# Patient Record
Sex: Female | Born: 1960 | Race: White | Hispanic: No | Marital: Married | Smoking: Never smoker
Health system: Southern US, Community
[De-identification: ages and names within clinical notes are randomized; demographics above are authoritative.]

## PROBLEM LIST (undated history)

## (undated) DIAGNOSIS — N83209 Unspecified ovarian cyst, unspecified side: Secondary | ICD-10-CM

## (undated) DIAGNOSIS — K5190 Ulcerative colitis, unspecified, without complications: Secondary | ICD-10-CM

## (undated) DIAGNOSIS — J45909 Unspecified asthma, uncomplicated: Secondary | ICD-10-CM

## (undated) DIAGNOSIS — F419 Anxiety disorder, unspecified: Secondary | ICD-10-CM

## (undated) DIAGNOSIS — E785 Hyperlipidemia, unspecified: Secondary | ICD-10-CM

## (undated) DIAGNOSIS — J189 Pneumonia, unspecified organism: Secondary | ICD-10-CM

## (undated) HISTORY — DX: Hyperlipidemia, unspecified: E78.5

## (undated) HISTORY — DX: Unspecified asthma, uncomplicated: J45.909

## (undated) HISTORY — DX: Pneumonia, unspecified organism: J18.9

## (undated) HISTORY — PX: COLOSTOMY: SHX63

## (undated) HISTORY — DX: Unspecified ovarian cyst, unspecified side: N83.209

## (undated) HISTORY — PX: CHOLECYSTECTOMY: SHX55

## (undated) HISTORY — DX: Anxiety disorder, unspecified: F41.9

## (undated) HISTORY — DX: Ulcerative colitis, unspecified, without complications: K51.90

---

## 2013-01-25 DIAGNOSIS — Z8719 Personal history of other diseases of the digestive system: Secondary | ICD-10-CM | POA: Insufficient documentation

## 2014-05-22 ENCOUNTER — Telehealth: Payer: Self-pay | Admitting: *Deleted

## 2014-05-22 DIAGNOSIS — Z0000 Encounter for general adult medical examination without abnormal findings: Secondary | ICD-10-CM

## 2014-05-22 NOTE — Telephone Encounter (Signed)
Routine mammogram orders placed.  Pt requesting appt same day as her appt with Dr Penne LashLeggett

## 2014-05-29 ENCOUNTER — Ambulatory Visit (INDEPENDENT_AMBULATORY_CARE_PROVIDER_SITE_OTHER): Payer: BC Managed Care – PPO

## 2014-05-29 DIAGNOSIS — Z1231 Encounter for screening mammogram for malignant neoplasm of breast: Secondary | ICD-10-CM

## 2014-05-29 DIAGNOSIS — Z0000 Encounter for general adult medical examination without abnormal findings: Secondary | ICD-10-CM

## 2014-06-11 ENCOUNTER — Encounter: Payer: Self-pay | Admitting: *Deleted

## 2014-06-12 ENCOUNTER — Encounter: Payer: Self-pay | Admitting: Obstetrics & Gynecology

## 2014-06-12 ENCOUNTER — Ambulatory Visit (INDEPENDENT_AMBULATORY_CARE_PROVIDER_SITE_OTHER): Payer: BC Managed Care – PPO | Admitting: Obstetrics & Gynecology

## 2014-06-12 VITALS — BP 138/89 | HR 96 | Resp 16 | Ht 60.0 in | Wt 164.0 lb

## 2014-06-12 DIAGNOSIS — E7800 Pure hypercholesterolemia, unspecified: Secondary | ICD-10-CM | POA: Insufficient documentation

## 2014-06-12 DIAGNOSIS — F339 Major depressive disorder, recurrent, unspecified: Secondary | ICD-10-CM | POA: Insufficient documentation

## 2014-06-12 DIAGNOSIS — F39 Unspecified mood [affective] disorder: Secondary | ICD-10-CM | POA: Insufficient documentation

## 2014-06-12 DIAGNOSIS — Z124 Encounter for screening for malignant neoplasm of cervix: Secondary | ICD-10-CM

## 2014-06-12 DIAGNOSIS — J45991 Cough variant asthma: Secondary | ICD-10-CM | POA: Insufficient documentation

## 2014-06-12 DIAGNOSIS — Z1151 Encounter for screening for human papillomavirus (HPV): Secondary | ICD-10-CM

## 2014-06-12 DIAGNOSIS — Z01419 Encounter for gynecological examination (general) (routine) without abnormal findings: Secondary | ICD-10-CM

## 2014-06-12 MED ORDER — ALBUTEROL SULFATE HFA 108 (90 BASE) MCG/ACT IN AERS: 2.0000 | INHALATION_SPRAY | Freq: Four times a day (QID) | RESPIRATORY_TRACT | Status: DC | PRN

## 2014-06-12 MED ORDER — BUPROPION HCL ER (SR) 150 MG PO TB12: 150.0000 mg | ORAL_TABLET | Freq: Two times a day (BID) | ORAL | Status: DC

## 2014-06-12 MED ORDER — ESCITALOPRAM OXALATE 10 MG PO TABS: 10.0000 mg | ORAL_TABLET | Freq: Every day | ORAL | Status: DC

## 2014-06-12 NOTE — Progress Notes (Signed)
  Subjective:    Renee Burns is a 53 y.o. female who presents for an annual exam. The patient has no complaints today. The patient is sexually active. GYN screening history: last pap: was normal. The patient wears seatbelts: yes. The patient participates in regular exercise: not asked. Has the patient ever been transfused or tattooed?: not asked. The patient reports that there is not domestic violence in her life.   Menstrual History: OB History   Grav Para Term Preterm Abortions TAB SAB Ect Mult Living   2 2 1 1      2       Patient's last menstrual period was 08/12/2012.   The following portions of the patient's history were reviewed and updated as appropriate: allergies, current medications, past family history, past medical history, past social history, past surgical history and problem list.  Review of Systems Pertinent items are noted in HPI.    Objective:      Filed Vitals:   06/12/14 1350  BP: 138/89  Pulse: 96  Resp: 16  Height: 5' (1.524 m)  Weight: 164 lb (74.39 kg)   Vitals:  WNL General appearance: alert, cooperative and no distress Head: Normocephalic, without obvious abnormality, atraumatic Eyes: negative Throat: lips, mucosa, and tongue normal; teeth and gums normal Lungs: clear to auscultation bilaterally Breasts: normal appearance, no masses or tenderness, No nipple retraction or dimpling, No nipple discharge or bleeding Heart: regular rate and rhythm Abdomen: soft, non-tender; bowel sounds normal; no masses,  no organomegaly  Pelvic:  External Genitalia:  Tanner V, no lesion Urethra:  No prolapse Vagina:  Pale pink, normal rugae, no blood or discharge Cervix:  No CMT, no lesion--cervix almost flush with vaginal vault Uterus:  Normal size and contour, non tender Adnexa:  Normal, no masses, non tender Rectovaginal Septum:  Non tender, no masses; Hemorroids  Extremities: no edema, redness or tenderness in the calves or thighs Skin: no lesions or  rash Lymph nodes: Axillary adenopathy: none     .    Assessment:    Healthy female exam.    Plan:    Pap with cotesting today.  No pap for 5 yrs if nml Mammogram up to date Meds refilled until she can find a primary care MD

## 2014-06-15 LAB — CYTOLOGY - PAP

## 2014-06-18 ENCOUNTER — Telehealth: Payer: Self-pay | Admitting: *Deleted

## 2014-06-18 NOTE — Telephone Encounter (Signed)
Pt notified of neg pap smear.

## 2014-09-04 ENCOUNTER — Encounter: Payer: Self-pay | Admitting: Obstetrics & Gynecology

## 2014-09-28 ENCOUNTER — Emergency Department
Admission: EM | Admit: 2014-09-28 | Discharge: 2014-09-28 | Disposition: A | Discharge status: Home / Self Care | Payer: BC Managed Care – PPO | Source: Home / Self Care | Attending: Emergency Medicine | Admitting: Emergency Medicine

## 2014-09-28 ENCOUNTER — Encounter: Payer: Self-pay | Admitting: *Deleted

## 2014-09-28 DIAGNOSIS — J0120 Acute ethmoidal sinusitis, unspecified: Secondary | ICD-10-CM

## 2014-09-28 MED ORDER — AMOXICILLIN-POT CLAVULANATE 875-125 MG PO TABS: 1.0000 | ORAL_TABLET | Freq: Two times a day (BID) | ORAL | Status: DC

## 2014-09-28 MED ORDER — HYDROCODONE-HOMATROPINE 5-1.5 MG/5ML PO SYRP: 5.0000 mL | ORAL_SOLUTION | Freq: Four times a day (QID) | ORAL | Status: DC | PRN

## 2014-09-28 NOTE — ED Provider Notes (Signed)
CSN: [id number]     Arrival date & time 09/28/14  1608 History   First MD Initiated Contact with Patient 09/28/14 1636     Chief Complaint  Patient presents with  . Hoarse  . Cough   (Consider location/radiation/quality/duration/timing/severity/associated sxs/prior Treatment) Patient is a 53 y.o. female presenting with cough. The history is provided by the patient. No language interpreter was used.  Cough Cough characteristics:  Non-productive Severity:  Moderate Onset quality:  Gradual Duration:  3 weeks Timing:  Constant Progression:  Worsening Chronicity:  New Smoker: no   Context: upper respiratory infection   Relieved by:  Nothing Worsened by:  Nothing tried Ineffective treatments:  None tried Associated symptoms: no chest pain   Risk factors: no recent infection   Pt complains of congestion.  Pt reports she has some phelgm going down the back of her throat. Pt reports she has had loss of her voice, raspy voice for 3 weeks  Past Medical History  Diagnosis Date  . Anxiety   . Asthma   . Pneumonia   . Hyperlipidemia   . Ovarian cyst   . Ulcerative colitis    Past Surgical History  Procedure Laterality Date  . Cholecystectomy    . Colostomy    . Cesarean section     Family History  Problem Relation Age of Onset  . Heart disease Father   . Cancer Father     bladder CA  . Diabetes Mother    History  Substance Use Topics  . Smoking status: Never Smoker   . Smokeless tobacco: Never Used  . Alcohol Use: No   OB History    Gravida Para Term Preterm AB TAB SAB Ectopic Multiple Living   2 2 1 1      2      Review of Systems  HENT: Positive for congestion. Negative for facial swelling.   Respiratory: Positive for cough.   Cardiovascular: Negative for chest pain.  All other systems reviewed and are negative.   Allergies  Loracarbef  Home Medications   Prior to Admission medications   Medication Sig Start Date End Date Taking? Authorizing Provider   albuterol (PROAIR HFA) 108 (90 BASE) MCG/ACT inhaler Inhale 2 puffs into the lungs every 6 (six) hours as needed for wheezing or shortness of breath. 06/12/14   Lesly DukesKelly H Leggett, MD  amoxicillin-clavulanate (AUGMENTIN) 875-125 MG per tablet Take 1 tablet by mouth every 12 (twelve) hours. 09/28/14   Elson AreasLeslie K Brayli Klingbeil, PA-C  aspirin 81 MG tablet Take 81 mg by mouth.    Historical Provider, MD  buPROPion (WELLBUTRIN SR) 150 MG 12 hr tablet Take 1 tablet (150 mg total) by mouth 2 (two) times daily. TAKE 1 TABLET TWICE A DAY 06/12/14   Lesly DukesKelly H Leggett, MD  escitalopram (LEXAPRO) 10 MG tablet Take 1 tablet (10 mg total) by mouth daily. TAKE 1 TABLET EVERY DAY 06/12/14   Lesly DukesKelly H Leggett, MD  HYDROcodone-homatropine (HYDROMET) 5-1.5 MG/5ML syrup Take 5 mLs by mouth every 6 (six) hours as needed for cough. 09/28/14   Elson AreasLeslie K Sabrie Moritz, PA-C  Multiple Vitamins-Minerals (WOMENS MULTIVITAMIN PLUS) TABS Take 1 tablet by mouth.    Historical Provider, MD   BP 149/91 mmHg  Pulse 74  Temp(Src) 97.7 F (36.5 C) (Oral)  Resp 14  Wt 172 lb (78.019 kg)  SpO2 99%  LMP 08/12/2012 Physical Exam  Constitutional: She is oriented to person, place, and time. She appears well-developed and well-nourished.  HENT:  Head: Normocephalic.  Right Ear: External ear normal.  Left Ear: External ear normal.  Mouth/Throat: Oropharynx is clear and moist.  Eyes: Conjunctivae and EOM are normal. Pupils are equal, round, and reactive to light.  Neck: Normal range of motion.  Cardiovascular: Normal rate.   Pulmonary/Chest: Effort normal and breath sounds normal.  Abdominal: Soft.  Neurological: She is alert and oriented to person, place, and time. She has normal reflexes.  Skin: Skin is warm.  Psychiatric: She has a normal mood and affect.  Nursing note and vitals reviewed.   ED Course  Procedures (including critical care time) Labs Review Labs Reviewed - No data to display  Imaging Review No results found.   MDM  Pt  counseled on symptoms.  I think laryngitis is secondary to cough and sinus drainage.  Pt is a non smoker.  Pt understands if not resolved with treatment whe will need to return for a chest xray   1. Subacute ethmoidal sinusitis    augmentin hydromet AVS Recheck in 1 week.   Chest xray if symptoms persist   Elson AreasLeslie K Ellis Koffler, [place] JerseyPA-C 09/28/14 1730

## 2014-09-28 NOTE — ED Notes (Signed)
Productive cough and hoarse with sore throat x 3 weeks. Denies fever.

## 2014-09-28 NOTE — Discharge Instructions (Signed)
Upper Respiratory Infection, Adult An upper respiratory infection (URI) is also known as the common cold. It is often caused by a type of germ (virus). Colds are easily spread (contagious). You can pass it to others by kissing, coughing, sneezing, or drinking out of the same glass. Usually, you get better in 1 or 2 weeks.  HOME CARE   Only take medicine as told by your doctor.  Use a warm mist humidifier or breathe in steam from a hot shower.  Drink enough water and fluids to keep your pee (urine) clear or pale yellow.  Get plenty of rest.  Return to work when your temperature is back to normal or as told by your doctor. You may use a face mask and wash your hands to stop your cold from spreading. GET HELP RIGHT AWAY IF:   After the first few days, you feel you are getting worse.  You have questions about your medicine.  You have chills, shortness of breath, or brown or red spit (mucus).  You have yellow or brown snot (nasal discharge) or pain in the face, especially when you bend forward.  You have a fever, puffy (swollen) neck, pain when you swallow, or white spots in the back of your throat.  You have a bad headache, ear pain, sinus pain, or chest pain.  You have a high-pitched whistling sound when you breathe in and out (wheezing).  You have a lasting cough or cough up blood.  You have sore muscles or a stiff neck. MAKE SURE YOU:   Understand these instructions.  Will watch your condition.  Will get help right away if you are not doing well or get worse. Document Released: 04/06/2008 Document Revised: 01/11/2012 Document Reviewed: 01/24/2014 [facility]ExitCare Patient Information 2015 [place], [place]. This information is not intended to replace advice given to you by your health care provider. Make sure you discuss any questions you have with your health care provider. Laryngitis At the top of your windpipe is your voice box. It is the source of your voice. Inside your voice box  are 2 bands of muscles called vocal cords. When you breathe, your vocal cords are relaxed and open so that air can get into the lungs. When you decide to say something, these cords come together and vibrate. The sound from these vibrations goes into your throat and comes out through your mouth as sound. Laryngitis is an inflammation of the vocal cords that causes hoarseness, cough, loss of voice, sore throat, and dry throat. Laryngitis can be temporary (acute) or long-term (chronic). Most cases of acute laryngitis improve with time.Chronic laryngitis lasts for more than 3 weeks. CAUSES Laryngitis can often be related to excessive smoking, talking, or yelling, as well as inhalation of toxic fumes and allergies. Acute laryngitis is usually caused by a viral infection, vocal strain, measles or mumps, or bacterial infections. Chronic laryngitis is usually caused by vocal cord strain, vocal cord injury, postnasal drip, growths on the vocal cords, or acid reflux. SYMPTOMS   Cough.  Sore throat.  Dry throat. RISK FACTORS  Respiratory infections.  Exposure to irritating substances, such as cigarette smoke, excessive amounts of alcohol, stomach acids, and workplace chemicals.  Voice trauma, such as vocal cord injury from shouting or speaking too loud. DIAGNOSIS  Your cargiver will perform a physical exam. During the physical exam, your caregiver will examine your throat. The most common sign of laryngitis is hoarseness. Laryngoscopy may be necessary to confirm the diagnosis of this condition. This  procedure allows your caregiver to look into the larynx. HOME CARE INSTRUCTIONS  Drink enough fluids to keep your urine clear or pale yellow.  Rest until you no longer have symptoms or as directed by your caregiver.  Breathe in moist air.  Take all medicine as directed by your caregiver.  Do not smoke.  Talk as little as possible (this includes whispering).  Write on paper instead of talking until  your voice is back to normal.  Follow up with your caregiver if your condition has not improved after 10 days. SEEK MEDICAL CARE IF:   You have trouble breathing.  You cough up blood.  You have persistent fever.  You have increasing pain.  You have difficulty swallowing. MAKE SURE YOU:  Understand these instructions.  Will watch your condition.  Will get help right away if you are not doing well or get worse. Document Released: 10/19/2005 Document Revised: 01/11/2012 Document Reviewed: 12/25/2010 [facility] Patient Information 2015 [place], [place]. This information is not intended to replace advice given to you by your health care provider. Make sure you discuss any questions you have with your health care provider.

## 2014-10-22 ENCOUNTER — Other Ambulatory Visit: Payer: Self-pay | Admitting: Emergency Medicine

## 2014-10-22 ENCOUNTER — Encounter: Payer: Self-pay | Admitting: *Deleted

## 2014-10-22 ENCOUNTER — Emergency Department (INDEPENDENT_AMBULATORY_CARE_PROVIDER_SITE_OTHER)
Admission: EM | Admit: 2014-10-22 | Discharge: 2014-10-22 | Disposition: A | Discharge status: Home / Self Care | Payer: BC Managed Care – PPO | Source: Home / Self Care | Attending: Emergency Medicine | Admitting: Emergency Medicine

## 2014-10-22 DIAGNOSIS — L03319 Cellulitis of trunk, unspecified: Secondary | ICD-10-CM

## 2014-10-22 DIAGNOSIS — L02219 Cutaneous abscess of trunk, unspecified: Secondary | ICD-10-CM

## 2014-10-22 MED ORDER — DOXYCYCLINE HYCLATE 100 MG PO CAPS: 100.0000 mg | ORAL_CAPSULE | Freq: Two times a day (BID) | ORAL | Status: DC

## 2014-10-22 MED ORDER — HYDROCODONE-ACETAMINOPHEN 5-325 MG PO TABS: 1.0000 | ORAL_TABLET | ORAL | Status: DC | PRN

## 2014-10-22 NOTE — ED Notes (Signed)
Pt c/o abscess on her LT hip area x 4 days. Denies fever.

## 2014-10-22 NOTE — ED Provider Notes (Signed)
CSN: [id number]     Arrival date & time 10/22/14  1800 History   First MD Initiated Contact with Patient 10/22/14 1801     Chief Complaint  Patient presents with  . Abscess    HPI Pt c/o abscess on her LT hip area x 4 days. Denies fever.  Complains of worsening dull pain in the area. Slight drainage. No radiation. Denies urinary symptoms, abdominal pain, or cardiorespiratory symptoms. She feels warm at times, but denies fever or chills or nausea or vomiting. Has not tried any particular treatment for this. Past Medical History  Diagnosis Date  . Anxiety   . Asthma   . Pneumonia   . Hyperlipidemia   . Ovarian cyst   . Ulcerative colitis    Past Surgical History  Procedure Laterality Date  . Cholecystectomy    . Colostomy    . Cesarean section     Family History  Problem Relation Age of Onset  . Heart disease Father   . Cancer Father     bladder CA  . Diabetes Mother    History  Substance Use Topics  . Smoking status: Never Smoker   . Smokeless tobacco: Never Used  . Alcohol Use: No   OB History    Gravida Para Term Preterm AB TAB SAB Ectopic Multiple Living   2 2 1 1      2      Review of Systems  All other systems reviewed and are negative.   Allergies  Augmentin and Loracarbef  Home Medications   Prior to Admission medications   Medication Sig Start Date End Date Taking? Authorizing Provider  albuterol (PROAIR HFA) 108 (90 BASE) MCG/ACT inhaler Inhale 2 puffs into the lungs every 6 (six) hours as needed for wheezing or shortness of breath. 06/12/14   Lesly DukesKelly H Leggett, MD  aspirin 81 MG tablet Take 81 mg by mouth.    Historical Provider, MD  buPROPion (WELLBUTRIN SR) 150 MG 12 hr tablet Take 1 tablet (150 mg total) by mouth 2 (two) times daily. TAKE 1 TABLET TWICE A DAY 06/12/14   Lesly DukesKelly H Leggett, MD  doxycycline (VIBRAMYCIN) 100 MG capsule Take 1 capsule (100 mg total) by mouth 2 (two) times daily. 10/22/14   Lajean Manesavid Massey, MD  escitalopram (LEXAPRO) 10 MG  tablet Take 1 tablet (10 mg total) by mouth daily. TAKE 1 TABLET EVERY DAY 06/12/14   Lesly DukesKelly H Leggett, MD  HYDROcodone-acetaminophen (NORCO/VICODIN) 5-325 MG per tablet Take 1-2 tablets by mouth every 4 (four) hours as needed for severe pain. Take with food. 10/22/14   Lajean Manesavid Massey, MD  Multiple Vitamins-Minerals (WOMENS MULTIVITAMIN PLUS) TABS Take 1 tablet by mouth.    Historical Provider, MD   BP 130/82 mmHg  Pulse 107  Temp(Src) 98.6 F (37 C) (Oral)  Resp 18  Ht 5' (1.524 m)  Wt 173 lb (78.472 kg)  BMI 33.79 kg/m2  SpO2 98%  LMP 08/12/2012 Physical Exam  Constitutional: She is oriented to person, place, and time. She appears well-developed and well-nourished. No distress.  HENT:  Head: Normocephalic and atraumatic.  Eyes: Conjunctivae and EOM are normal. Pupils are equal, round, and reactive to light. No scleral icterus.  Neck: Normal range of motion.  Cardiovascular: Normal rate.   Pulmonary/Chest: Effort normal.  Abdominal: She exhibits no distension.  Musculoskeletal: Normal range of motion. She exhibits no edema or tenderness.  Neurological: She is alert and oriented to person, place, and time. No cranial nerve deficit.  Skin: Skin  is warm.     Left flank: Red, swollen, fluctuant, exquisitely tender 1 x 1 cm area with surrounding 3 x 3 cm area of induration and warmth. In the center is a pustule.  Psychiatric: She has a normal mood and affect.  Nursing note and vitals reviewed.   ED Course  INCISION AND DRAINAGE Date/Time: 10/22/2014 6:34 PM Performed by: Georgina PillionMASSEY, DAVID Authorized by: Georgina PillionMASSEY, DAVID Consent: Verbal consent obtained. Risks and benefits: risks, benefits and alternatives were discussed Consent given by: patient Patient understanding: patient states understanding of the procedure being performed Patient identity confirmed: verbally with patient Time out: Immediately prior to procedure a "time out" was called to verify the correct patient, procedure,  equipment, support staff and site/side marked as required. Type: abscess Body area: trunk (Left flank) Anesthesia: local infiltration Local anesthetic: lidocaine 1% without epinephrine Scalpel size: 11 (Prior to incision and drainage, Betadine prep done) Incision type: single straight Complexity: simple Drainage: purulent Drainage amount: moderate Wound treatment: wound left open Packing material: 1/4 in iodoform gauze Patient tolerance: Patient tolerated the procedure well with no immediate complications Comments:  Wound care instructions   Labs Review Labs Reviewed - No data to display  Imaging Review No results found.   MDM   1. Cellulitis and abscess of trunk   2. Cellulitis of trunk, unspecified site   left flank  I and D performed, see details above. Wound culture sent  New Prescriptions   DOXYCYCLINE (VIBRAMYCIN) 100 MG CAPSULE    Take 1 capsule (100 mg total) by mouth 2 (two) times daily.   HYDROCODONE-ACETAMINOPHEN (NORCO/VICODIN) 5-325 MG PER TABLET    Take 1-2 tablets by mouth every 4 (four) hours as needed for severe pain. Take with food.   wound care discussed, questions invited and answered. Return tomorrow for wound recheck, may need to repack the wound She voiced understanding and agreement   Lajean Manesavid Massey, MD 10/22/14 2037

## 2014-10-23 ENCOUNTER — Emergency Department (INDEPENDENT_AMBULATORY_CARE_PROVIDER_SITE_OTHER)
Admission: EM | Admit: 2014-10-23 | Discharge: 2014-10-23 | Disposition: A | Discharge status: Home / Self Care | Payer: BC Managed Care – PPO | Source: Home / Self Care | Attending: Family Medicine | Admitting: Family Medicine

## 2014-10-23 ENCOUNTER — Encounter: Payer: Self-pay | Admitting: *Deleted

## 2014-10-23 DIAGNOSIS — Z4801 Encounter for change or removal of surgical wound dressing: Secondary | ICD-10-CM

## 2014-10-23 NOTE — ED Provider Notes (Signed)
CSN: [id number]     Arrival date & time 10/23/14  [id number] History   First MD Initiated Contact with Patient 10/23/14 [phone]     Chief Complaint  Patient presents with  . Wound Check      HPI Comments: Patient returns for follow-up of abscess on her left side.  She reports decreased pain.  No fevers, chills, and sweats   The history is provided by the patient.    Past Medical History  Diagnosis Date  . Anxiety   . Asthma   . Pneumonia   . Hyperlipidemia   . Ovarian cyst   . Ulcerative colitis    Past Surgical History  Procedure Laterality Date  . Cholecystectomy    . Colostomy    . Cesarean section     Family History  Problem Relation Age of Onset  . Heart disease Father   . Cancer Father     bladder CA  . Diabetes Mother    History  Substance Use Topics  . Smoking status: Never Smoker   . Smokeless tobacco: Never Used  . Alcohol Use: No   OB History    Gravida Para Term Preterm AB TAB SAB Ectopic Multiple Living   2 2 1 1      2      Review of Systems  Constitutional: Negative for fever, activity change, appetite change and fatigue.    Allergies  Augmentin and Loracarbef  Home Medications   Prior to Admission medications   Medication Sig Start Date End Date Taking? Authorizing Provider  albuterol (PROAIR HFA) 108 (90 BASE) MCG/ACT inhaler Inhale 2 puffs into the lungs every 6 (six) hours as needed for wheezing or shortness of breath. 06/12/14   Lesly DukesKelly H Leggett, MD  aspirin 81 MG tablet Take 81 mg by mouth.    Historical Provider, MD  buPROPion (WELLBUTRIN SR) 150 MG 12 hr tablet Take 1 tablet (150 mg total) by mouth 2 (two) times daily. TAKE 1 TABLET TWICE A DAY 06/12/14   Lesly DukesKelly H Leggett, MD  doxycycline (VIBRAMYCIN) 100 MG capsule Take 1 capsule (100 mg total) by mouth 2 (two) times daily. 10/22/14   Lajean Manesavid Massey, MD  escitalopram (LEXAPRO) 10 MG tablet Take 1 tablet (10 mg total) by mouth daily. TAKE 1 TABLET EVERY DAY 06/12/14   Lesly DukesKelly H Leggett, MD    HYDROcodone-acetaminophen (NORCO/VICODIN) 5-325 MG per tablet Take 1-2 tablets by mouth every 4 (four) hours as needed for severe pain. Take with food. 10/22/14   Lajean Manesavid Massey, MD  Multiple Vitamins-Minerals (WOMENS MULTIVITAMIN PLUS) TABS Take 1 tablet by mouth.    Historical Provider, MD   BP 151/83 mmHg  Pulse 96  Temp(Src) 98.2 F (36.8 C) (Oral)  Resp 16  Ht 5' (1.524 m)  Wt 173 lb (78.472 kg)  BMI 33.79 kg/m2  SpO2 97%  LMP 08/12/2012 Physical Exam  Constitutional: She is oriented to person, place, and time. She appears well-developed and well-nourished.  Patient is obese (BMI 33.8)  Eyes: Pupils are equal, round, and reactive to light.  Neurological: She is alert and oriented to person, place, and time.  Skin: Skin is warm and dry.     Removed packing from abscess cavity left flank:    Cavity about 8mm deep with minimal purulent drainage present.  Wound has surrounding erythema to about 5cm diameter, mildly tender without swelling.  Nursing note and vitals reviewed.   ED Course  Procedures  Inserted small length (about 3cm long) of 1/4inch packing  to maintain patency of wound opening     MDM   1. Dressing change or removal, surgical wound; cellulitis improving     Leave bandage in place until followup visit tomorrow.  Keep wound clean and dry.  Continue antibiotic. Return tomorrow.    Lattie HawStephen A Beese, MD 10/23/14 [phone]

## 2014-10-23 NOTE — ED Notes (Signed)
Renee Burns is here today for a wound recheck of her LT side abd abscess.

## 2014-10-23 NOTE — Discharge Instructions (Signed)
Leave bandage in place until followup visit tomorrow.  Keep wound clean and dry.  Continue antibiotic.

## 2014-10-24 ENCOUNTER — Encounter: Payer: Self-pay | Admitting: *Deleted

## 2014-10-24 ENCOUNTER — Emergency Department (INDEPENDENT_AMBULATORY_CARE_PROVIDER_SITE_OTHER)
Admission: EM | Admit: 2014-10-24 | Discharge: 2014-10-24 | Disposition: A | Discharge status: Home / Self Care | Payer: BC Managed Care – PPO | Source: Home / Self Care | Attending: Emergency Medicine | Admitting: Emergency Medicine

## 2014-10-24 DIAGNOSIS — Z09 Encounter for follow-up examination after completed treatment for conditions other than malignant neoplasm: Secondary | ICD-10-CM

## 2014-10-24 NOTE — ED Notes (Signed)
Renee Burns is here today for a recheck of her wound.

## 2014-10-24 NOTE — ED Provider Notes (Addendum)
CSN: [id number]     Arrival date & time 10/24/14  0957 History   First MD Initiated Contact with Patient 10/24/14 [phone]     Chief Complaint  Patient presents with  . Wound Check   (Consider location/radiation/quality/duration/timing/severity/associated sxs/prior Treatment) HPI Here for recheck abscess left side. Incision and drainage was performed 2 days ago, she was rechecked here yesterday and packing was replaced. She reports pain is much improved. Less drainage. No fever. Past Medical History  Diagnosis Date  . Anxiety   . Asthma   . Pneumonia   . Hyperlipidemia   . Ovarian cyst   . Ulcerative colitis    Past Surgical History  Procedure Laterality Date  . Cholecystectomy    . Colostomy    . Cesarean section     Family History  Problem Relation Age of Onset  . Heart disease Father   . Cancer Father     bladder CA  . Diabetes Mother    History  Substance Use Topics  . Smoking status: Never Smoker   . Smokeless tobacco: Never Used  . Alcohol Use: No   OB History    Gravida Para Term Preterm AB TAB SAB Ectopic Multiple Living   2 2 1 1      2      Review of Systems  Allergies  Augmentin and Loracarbef  Home Medications   Prior to Admission medications   Medication Sig Start Date End Date Taking? Authorizing Provider  albuterol (PROAIR HFA) 108 (90 BASE) MCG/ACT inhaler Inhale 2 puffs into the lungs every 6 (six) hours as needed for wheezing or shortness of breath. 06/12/14   Lesly DukesKelly H Leggett, MD  aspirin 81 MG tablet Take 81 mg by mouth.    Historical Provider, MD  buPROPion (WELLBUTRIN SR) 150 MG 12 hr tablet Take 1 tablet (150 mg total) by mouth 2 (two) times daily. TAKE 1 TABLET TWICE A DAY 06/12/14   Lesly DukesKelly H Leggett, MD  doxycycline (VIBRAMYCIN) 100 MG capsule Take 1 capsule (100 mg total) by mouth 2 (two) times daily. 10/22/14   Lajean Manesavid Massey, MD  escitalopram (LEXAPRO) 10 MG tablet Take 1 tablet (10 mg total) by mouth daily. TAKE 1 TABLET EVERY DAY 06/12/14    Lesly DukesKelly H Leggett, MD  HYDROcodone-acetaminophen (NORCO/VICODIN) 5-325 MG per tablet Take 1-2 tablets by mouth every 4 (four) hours as needed for severe pain. Take with food. 10/22/14   Lajean Manesavid Massey, MD  Multiple Vitamins-Minerals (WOMENS MULTIVITAMIN PLUS) TABS Take 1 tablet by mouth.    Historical Provider, MD   BP 130/85 mmHg  Pulse 103  Temp(Src) 98.4 F (36.9 C) (Oral)  Resp 18  SpO2 98%  LMP 08/12/2012 Physical Exam No distress. Pulse 92 Wound is much improving. Some mild erythema surrounding the wound with scant yellow drainage from the wound. ED Course  Procedures (including critical care time) Labs Review Labs Reviewed - No data to display  Imaging Review No results found.   MDM   1. Encounter for recheck of abscess following incision and drainage    she is improving significantly. Wound repacked today. Dressing applied. Continue doxycycline as prescribed. Recheck tomorrow  Addendum: Wound culture grew out staph aureus, sensitivities still pending. Continue doxycycline as prescribed   Lajean Manesavid Massey, MD 10/24/14 1020  Lajean Manesavid Massey, MD 10/24/14 1322

## 2014-10-25 ENCOUNTER — Emergency Department (INDEPENDENT_AMBULATORY_CARE_PROVIDER_SITE_OTHER)
Admission: EM | Admit: 2014-10-25 | Discharge: 2014-10-25 | Disposition: A | Discharge status: Home / Self Care | Payer: BC Managed Care – PPO | Source: Home / Self Care | Attending: Emergency Medicine | Admitting: Emergency Medicine

## 2014-10-25 ENCOUNTER — Encounter: Payer: Self-pay | Admitting: Emergency Medicine

## 2014-10-25 DIAGNOSIS — Z09 Encounter for follow-up examination after completed treatment for conditions other than malignant neoplasm: Secondary | ICD-10-CM

## 2014-10-25 LAB — WOUND CULTURE
Gram Stain: NONE SEEN
Gram Stain: NONE SEEN

## 2014-10-25 NOTE — ED Notes (Signed)
Wound re check. 

## 2014-10-26 NOTE — ED Provider Notes (Signed)
CSN: [id number]     Arrival date & time 10/25/14  1042 History   First MD Initiated Contact with Patient 10/24/14 [phone]     Chief Complaint  Patient presents with  . Wound Check   (Consider location/radiation/quality/duration/timing/severity/associated sxs/prior Treatment) Wound Check  Patient is a 53 y.o. female presenting with wound check.   Here for recheck abscess left side. Incision and drainage was performed 3 days ago, she was rechecked here yesterday and packing was replaced. She reports pain is much improved. Less drainage. No fever. Past Medical History  Diagnosis Date  . Anxiety   . Asthma   . Pneumonia   . Hyperlipidemia   . Ovarian cyst   . Ulcerative colitis    Past Surgical History  Procedure Laterality Date  . Cholecystectomy    . Colostomy    . Cesarean section     Family History  Problem Relation Age of Onset  . Heart disease Father   . Cancer Father     bladder CA  . Diabetes Mother    History  Substance Use Topics  . Smoking status: Never Smoker   . Smokeless tobacco: Never Used  . Alcohol Use: No   OB History    Gravida Para Term Preterm AB TAB SAB Ectopic Multiple Living   2 2 1 1      2      Review of Systems   Allergies  Augmentin and Loracarbef  Home Medications   Prior to Admission medications   Medication Sig Start Date End Date Taking? Authorizing Provider  albuterol (PROAIR HFA) 108 (90 BASE) MCG/ACT inhaler Inhale 2 puffs into the lungs every 6 (six) hours as needed for wheezing or shortness of breath. 06/12/14   Lesly DukesKelly H Leggett, MD  aspirin 81 MG tablet Take 81 mg by mouth.    Historical Provider, MD  buPROPion (WELLBUTRIN SR) 150 MG 12 hr tablet Take 1 tablet (150 mg total) by mouth 2 (two) times daily. TAKE 1 TABLET TWICE A DAY 06/12/14   Lesly DukesKelly H Leggett, MD  doxycycline (VIBRAMYCIN) 100 MG capsule Take 1 capsule (100 mg total) by mouth 2 (two) times daily. 10/22/14   Lajean Manesavid Massey, MD  escitalopram (LEXAPRO) 10 MG tablet Take  1 tablet (10 mg total) by mouth daily. TAKE 1 TABLET EVERY DAY 06/12/14   Lesly DukesKelly H Leggett, MD  HYDROcodone-acetaminophen (NORCO/VICODIN) 5-325 MG per tablet Take 1-2 tablets by mouth every 4 (four) hours as needed for severe pain. Take with food. 10/22/14   Lajean Manesavid Massey, MD  Multiple Vitamins-Minerals (WOMENS MULTIVITAMIN PLUS) TABS Take 1 tablet by mouth.    Historical Provider, MD   BP 123/82 mmHg  Pulse 91  Temp(Src) 97.9 F (36.6 C) (Oral)  SpO2 96%  LMP 08/12/2012 Physical Exam  No distress.  Wound is much improving. Less tenderness and erythema surrounding the wound with scant yellow drainage from the wound. ED Course  Procedures  (including critical care time) Labs Review Labs Reviewed - No data to display  Imaging Review No results found.   MDM   1. Encounter for recheck of abscess following incision and drainage    she is improving significantly. Wound repacked today. Dressing applied. Continue doxycycline as prescribed. Recheck tomorrow  Addendum: Wound culture grew out staph aureus, (not MRSA), sensitive to doxycycline Continue doxycycline as prescribed   Lajean Manesavid Massey, MD 10/26/14 1349

## 2014-10-27 ENCOUNTER — Emergency Department (INDEPENDENT_AMBULATORY_CARE_PROVIDER_SITE_OTHER)
Admission: EM | Admit: 2014-10-27 | Discharge: 2014-10-27 | Disposition: A | Discharge status: Home / Self Care | Payer: BC Managed Care – PPO | Source: Home / Self Care | Attending: Emergency Medicine | Admitting: Emergency Medicine

## 2014-10-27 ENCOUNTER — Encounter: Payer: Self-pay | Admitting: *Deleted

## 2014-10-27 ENCOUNTER — Telehealth: Payer: Self-pay | Admitting: *Deleted

## 2014-10-27 DIAGNOSIS — Z09 Encounter for follow-up examination after completed treatment for conditions other than malignant neoplasm: Secondary | ICD-10-CM

## 2014-10-27 NOTE — ED Provider Notes (Signed)
CSN: [id number]     Arrival date & time 10/27/14  1240 History   First MD Initiated Contact with Patient 10/27/14 1409     Chief Complaint  Patient presents with  . Wound Check   (Consider location/radiation/quality/duration/timing/severity/associated sxs/prior Treatment) HPI Pt is here for follow up of wound on lower left side . Denies fever or any new symptoms. Pain 2/10. Site is less red and tender Past Medical History  Diagnosis Date  . Anxiety   . Asthma   . Pneumonia   . Hyperlipidemia   . Ovarian cyst   . Ulcerative colitis    Past Surgical History  Procedure Laterality Date  . Cholecystectomy    . Colostomy    . Cesarean section     Family History  Problem Relation Age of Onset  . Heart disease Father   . Cancer Father     bladder CA  . Diabetes Mother    History  Substance Use Topics  . Smoking status: Never Smoker   . Smokeless tobacco: Never Used  . Alcohol Use: No   OB History    Gravida Para Term Preterm AB TAB SAB Ectopic Multiple Living   2 2 1 1      2      Review of Systems  All other systems reviewed and are negative.   Allergies  Augmentin and Loracarbef  Home Medications   Prior to Admission medications   Medication Sig Start Date End Date Taking? Authorizing Provider  albuterol (PROAIR HFA) 108 (90 BASE) MCG/ACT inhaler Inhale 2 puffs into the lungs every 6 (six) hours as needed for wheezing or shortness of breath. 06/12/14   Lesly DukesKelly H Leggett, MD  aspirin 81 MG tablet Take 81 mg by mouth.    Historical Provider, MD  buPROPion (WELLBUTRIN SR) 150 MG 12 hr tablet Take 1 tablet (150 mg total) by mouth 2 (two) times daily. TAKE 1 TABLET TWICE A DAY 06/12/14   Lesly DukesKelly H Leggett, MD  doxycycline (VIBRAMYCIN) 100 MG capsule Take 1 capsule (100 mg total) by mouth 2 (two) times daily. 10/22/14   Lajean Manesavid Massey, MD  escitalopram (LEXAPRO) 10 MG tablet Take 1 tablet (10 mg total) by mouth daily. TAKE 1 TABLET EVERY DAY 06/12/14   Lesly DukesKelly H Leggett, MD   HYDROcodone-acetaminophen (NORCO/VICODIN) 5-325 MG per tablet Take 1-2 tablets by mouth every 4 (four) hours as needed for severe pain. Take with food. 10/22/14   Lajean Manesavid Massey, MD  Multiple Vitamins-Minerals (WOMENS MULTIVITAMIN PLUS) TABS Take 1 tablet by mouth.    Historical Provider, MD   BP 126/85 mmHg  Pulse 85  Temp(Src) 97.7 F (36.5 C) (Oral)  Ht 5' (1.524 m)  Wt 172 lb 8 oz (78.245 kg)  BMI 33.69 kg/m2  SpO2 98%  LMP 08/12/2012 Physical Exam  Constitutional: She is oriented to person, place, and time. She appears well-developed and well-nourished. No distress.  HENT:  Head: Normocephalic and atraumatic.  Eyes: Conjunctivae and EOM are normal. Pupils are equal, round, and reactive to light. No scleral icterus.  Neck: Normal range of motion.  Cardiovascular: Normal rate.   Pulmonary/Chest: Effort normal.  Abdominal: She exhibits no distension.  Musculoskeletal: Normal range of motion.  Wound/abscess cavity left side is much less tender. No drainage. Minimal surrounding erythema No fluctuance  Neurological: She is alert and oriented to person, place, and time.  Skin: Skin is warm.  Psychiatric: She has a normal mood and affect.  Nursing note and vitals reviewed.   ED  Course  Procedures (including critical care time) Labs Review Labs Reviewed - No data to display  Imaging Review No results found.   MDM   1. Encounter for recheck of abscess following incision and drainage    continues to improve significantly.  Packing removed today. Wound cleansed. Redressed Discussed daily wound care. Continue doxycycline. Anticipatory guidance discussed. Recheck in 2-4 days.    Lajean Manesavid Massey, MD 10/27/14 [id number]

## 2014-10-27 NOTE — ED Notes (Signed)
Pt is here for follow up of wound on lower left side of her back.  Denies fever or any new symptoms.  Pain 3/10.  Site is red and tender

## 2014-12-05 ENCOUNTER — Encounter: Payer: Self-pay | Admitting: Physician Assistant

## 2014-12-05 ENCOUNTER — Ambulatory Visit (INDEPENDENT_AMBULATORY_CARE_PROVIDER_SITE_OTHER): Payer: BLUE CROSS/BLUE SHIELD | Admitting: Physician Assistant

## 2014-12-05 VITALS — BP 125/75 | HR 80 | Ht 60.0 in | Wt 162.0 lb

## 2014-12-05 DIAGNOSIS — E669 Obesity, unspecified: Secondary | ICD-10-CM | POA: Diagnosis not present

## 2014-12-05 DIAGNOSIS — Z79899 Other long term (current) drug therapy: Secondary | ICD-10-CM

## 2014-12-05 DIAGNOSIS — F39 Unspecified mood [affective] disorder: Secondary | ICD-10-CM

## 2014-12-05 DIAGNOSIS — R635 Abnormal weight gain: Secondary | ICD-10-CM

## 2014-12-05 DIAGNOSIS — E7800 Pure hypercholesterolemia, unspecified: Secondary | ICD-10-CM

## 2014-12-05 DIAGNOSIS — F339 Major depressive disorder, recurrent, unspecified: Secondary | ICD-10-CM | POA: Diagnosis not present

## 2014-12-05 MED ORDER — BUPROPION HCL ER (SR) 150 MG PO TB12: 150.0000 mg | ORAL_TABLET | Freq: Two times a day (BID) | ORAL | Status: DC

## 2014-12-05 MED ORDER — ESCITALOPRAM OXALATE 10 MG PO TABS: 10.0000 mg | ORAL_TABLET | Freq: Every day | ORAL | Status: DC

## 2014-12-05 MED ORDER — PHENTERMINE HCL 37.5 MG PO TABS: 37.5000 mg | ORAL_TABLET | Freq: Every day | ORAL | Status: DC

## 2014-12-07 DIAGNOSIS — E669 Obesity, unspecified: Secondary | ICD-10-CM | POA: Insufficient documentation

## 2014-12-07 DIAGNOSIS — R635 Abnormal weight gain: Secondary | ICD-10-CM | POA: Insufficient documentation

## 2014-12-07 NOTE — Progress Notes (Signed)
   Subjective:    Patient ID: Renee Burns, female    DOB: 1961/05/16, 54 y.o.   MRN: [id number]  HPI Patient is a 54 year old female who presents to the clinic to establish care.  .. Active Ambulatory Problems    Diagnosis Date Noted  . Hypercholesteremia 06/12/2014  . H/O chronic ulcerative colitis 01/25/2013  . Asthma, cough variant 06/12/2014  . Affective disorder 06/12/2014  . Depression, recurrent 06/12/2014  . Obesity 12/07/2014  . Abnormal weight gain 12/07/2014   Resolved Ambulatory Problems    Diagnosis Date Noted  . No Resolved Ambulatory Problems   Past Medical History  Diagnosis Date  . Anxiety   . Asthma   . Pneumonia   . Hyperlipidemia   . Ovarian cyst   . Ulcerative colitis    .Marland Kitchen. Family History  Problem Relation Age of Onset  . Heart disease Father   . Cancer Father     bladder CA  . Diabetes Mother    .Marland Kitchen. History   Social History  . Marital Status: Married    Spouse Name: N/A    Number of Children: N/A  . Years of Education: N/A   Occupational History  . Paediatric nursebarber    Social History Main Topics  . Smoking status: Never Smoker   . Smokeless tobacco: Never Used  . Alcohol Use: No  . Drug Use: No  . Sexual Activity: Yes   Other Topics Concern  . Not on file   Social History Narrative   Patient reports to have had her last physical on 01/31/2014. She is up-to-date on her mammogram as well as her Pap smear.  Patient is here to discuss weight. She is very frustrated with her current weight. Since January 4 she has aggressively tried to lose weight with restricting sugar, stop drinking soft drinks and weight lifting and walking. She has lost 9 pounds but is desiring extra help. She has never tried any medication for weight loss.  Review of Systems  All other systems reviewed and are negative.      Objective:   Physical Exam  Constitutional: She is oriented to person, place, and time. She appears well-developed and well-nourished.  HENT:   Head: Normocephalic and atraumatic.  Cardiovascular: Normal rate, regular rhythm and normal heart sounds.   Pulmonary/Chest: Effort normal and breath sounds normal.  Neurological: She is alert and oriented to person, place, and time.  Skin: Skin is dry.  Psychiatric: She has a normal mood and affect. Her behavior is normal.          Assessment & Plan:  Hypercholesterolemia-patient is currently not on treatment. She does have a history of taking a statin before but stopped. Last labs were 4/15 and she reports her total cholesterol being 267. She is unaware of the breakdown. Hopefully we'll get labs in her records.  Depression/affect disorder-patient has been on Wellbutrin and Lexapro for many years. She is well controlled but discussed wanting to see if she can get off this medicine. Since we're focusing on weight loss and starting another drug I advised her to wait until 6 month follow-up to potentially discuss tapering down.  Obesity/abnormal weight gain- patient appears to be put in a lot of effort with diet and exercise. She needs a boost. We started phentermine today. Discussed side effects of medication. Patient aware she has any insomnia/tachycardia to stop medication. Encouraged to continue with her no sugar diet and regular exercise. Follow-up in one month.

## 2015-01-02 ENCOUNTER — Encounter: Payer: Self-pay | Admitting: Physician Assistant

## 2015-01-02 ENCOUNTER — Ambulatory Visit (INDEPENDENT_AMBULATORY_CARE_PROVIDER_SITE_OTHER): Payer: BLUE CROSS/BLUE SHIELD | Admitting: Physician Assistant

## 2015-01-02 VITALS — BP 114/67 | HR 84 | Ht 60.0 in | Wt 150.0 lb

## 2015-01-02 DIAGNOSIS — E669 Obesity, unspecified: Secondary | ICD-10-CM | POA: Diagnosis not present

## 2015-01-02 DIAGNOSIS — R635 Abnormal weight gain: Secondary | ICD-10-CM

## 2015-01-02 MED ORDER — PHENTERMINE HCL 37.5 MG PO TABS: 37.5000 mg | ORAL_TABLET | Freq: Every day | ORAL | Status: DC

## 2015-01-02 NOTE — Progress Notes (Signed)
   Subjective:    Patient ID: Renee Burns, female    DOB: [birth year], 54 y.o.   MRN: [id number]  HPI Patient is a 54 year old female who presents to the clinic for one-month follow-up on phentermine. She has had some dry mouth but other than that she denies any side effects. She denies any insomnia, palpitations or increased anxiety. She does feel like this has motivated her to increase her exercise. She continues to watch her diet. She is down 12 pounds this month.   Review of Systems  All other systems reviewed and are negative.      Objective:   Physical Exam  Constitutional: She is oriented to person, place, and time. She appears well-developed and well-nourished.  HENT:  Head: Normocephalic and atraumatic.  Cardiovascular: Normal rate, regular rhythm and normal heart sounds.   Neurological: She is alert and oriented to person, place, and time.  Skin: Skin is dry.  Psychiatric: She has a normal mood and affect. Her behavior is normal.          Assessment & Plan:  Abnormal weight gain-patient is down 12 pounds. She is doing well. I will give her a 3 month supply. Follow-up in 3 months. Encouraged her to continue to exercise and make good diet decisions. Depending on how much weight loss can discuss a decrease to one half tab for 3 more months and then discontinued.

## 2015-04-08 ENCOUNTER — Ambulatory Visit (INDEPENDENT_AMBULATORY_CARE_PROVIDER_SITE_OTHER): Payer: BLUE CROSS/BLUE SHIELD | Admitting: Family Medicine

## 2015-04-08 ENCOUNTER — Encounter: Payer: Self-pay | Admitting: Family Medicine

## 2015-04-08 VITALS — BP 108/75 | HR 88 | Wt 132.0 lb

## 2015-04-08 DIAGNOSIS — E669 Obesity, unspecified: Secondary | ICD-10-CM | POA: Diagnosis not present

## 2015-04-08 MED ORDER — PHENTERMINE HCL 37.5 MG PO TABS: 37.5000 mg | ORAL_TABLET | Freq: Every day | ORAL | Status: DC

## 2015-04-08 NOTE — Progress Notes (Signed)
CC: Trenton GammonMelanie Wente is a 54 y.o. female is here for bp and weight check   Subjective: HPI:  Follow-up obesity: Since January she has been successful at losing 40 pounds. She is cutting out carbohydrates and starches in her diet. Trying to stay active on a daily basis. Has been taking phentermine on a daily basis without any known side effects. Denies worsening of anxiety, depression. Denies difficulty with sleeping, palpitations, nor irritability. She is not sure whether or not the medication is providing her with appetite suppression since she's been on it so long she is having trouble judging its effectiveness. She denies any known side effects.   Review Of Systems Outlined In HPI  Past Medical History  Diagnosis Date  . Anxiety   . Asthma   . Pneumonia   . Hyperlipidemia   . Ovarian cyst   . Ulcerative colitis     Past Surgical History  Procedure Laterality Date  . Cholecystectomy    . Colostomy    . Cesarean section     Family History  Problem Relation Age of Onset  . Heart disease Father   . Cancer Father     bladder CA  . Diabetes Mother     History   Social History  . Marital Status: Married    Spouse Name: N/A  . Number of Children: N/A  . Years of Education: N/A   Occupational History  . Paediatric nursebarber    Social History Main Topics  . Smoking status: Never Smoker   . Smokeless tobacco: Never Used  . Alcohol Use: No  . Drug Use: No  . Sexual Activity: Yes   Other Topics Concern  . Not on file   Social History Narrative     Objective: BP 108/75 mmHg  Pulse 88  Wt 132 lb (59.875 kg)  LMP 08/12/2012  Vital signs reviewed. General: Alert and Oriented, No Acute Distress HEENT: Pupils equal, round, reactive to light. Conjunctivae clear.  External ears unremarkable.  Moist mucous membranes. Lungs: Clear and comfortable work of breathing, speaking in full sentences without accessory muscle use. Cardiac: Regular rate and rhythm.  Neuro: CN II-XII grossly  intact, gait normal. Extremities: No peripheral edema.  Strong peripheral pulses.  Mental Status: No depression, anxiety, nor agitation. Logical though process. Skin: Warm and dry.  Assessment & Plan: Shawna OrleansMelanie was seen today for bp and weight check.  Diagnoses and all orders for this visit:  Obesity  Other orders -     phentermine (ADIPEX-P) 37.5 MG tablet; Take 1 tablet (37.5 mg total) by mouth daily before breakfast.    Obesity: Improved, congratulated her success, discussed usually after 2 months of phentermine its effectiveness wears off. We will test this by having her come back for nurse visit in 1 month, refills provided for the next 4 weeks.  Return in about 4 weeks (around 05/06/2015) for Nurse visit .

## 2015-05-08 ENCOUNTER — Ambulatory Visit: Payer: BLUE CROSS/BLUE SHIELD

## 2015-08-30 ENCOUNTER — Other Ambulatory Visit: Payer: Self-pay | Admitting: Physician Assistant

## 2015-09-09 ENCOUNTER — Ambulatory Visit: Payer: BLUE CROSS/BLUE SHIELD | Admitting: Physician Assistant

## 2015-09-23 ENCOUNTER — Encounter: Payer: Self-pay | Admitting: Physician Assistant

## 2015-09-23 ENCOUNTER — Ambulatory Visit (INDEPENDENT_AMBULATORY_CARE_PROVIDER_SITE_OTHER): Payer: BLUE CROSS/BLUE SHIELD | Admitting: Physician Assistant

## 2015-09-23 VITALS — BP 128/78 | HR 87 | Ht 60.0 in | Wt 145.0 lb

## 2015-09-23 DIAGNOSIS — F339 Major depressive disorder, recurrent, unspecified: Secondary | ICD-10-CM | POA: Diagnosis not present

## 2015-09-23 DIAGNOSIS — F39 Unspecified mood [affective] disorder: Secondary | ICD-10-CM

## 2015-09-23 MED ORDER — ESCITALOPRAM OXALATE 10 MG PO TABS: ORAL_TABLET | ORAL | Status: DC

## 2015-09-23 MED ORDER — BUPROPION HCL ER (SR) 150 MG PO TB12: 150.0000 mg | ORAL_TABLET | Freq: Two times a day (BID) | ORAL | Status: DC

## 2015-09-23 NOTE — Progress Notes (Signed)
   Subjective:    Patient ID: Renee GammonMelanie Merkley, female    DOB: 1961-10-04, 54 y.o.   MRN: [id number]  HPI Pt presents to the clinic for follow up on depression and medication. She reports a little increase in anxiety and a little more down than usual. She does have some stress at work and some home life stress. Her daughter is due next month to have a baby. The holidays are always a little stressful. She is not exercising regularly like she had done the in the past. She denies any suicidal or homicidal thoughts.      Review of Systems  All other systems reviewed and are negative.      Objective:   Physical Exam  Constitutional: She is oriented to person, place, and time. She appears well-developed and well-nourished.  HENT:  Head: Normocephalic and atraumatic.  Cardiovascular: Normal rate, regular rhythm and normal heart sounds.   Pulmonary/Chest: Effort normal and breath sounds normal. She has no wheezes.  Neurological: She is alert and oriented to person, place, and time.  Psychiatric: She has a normal mood and affect. Her behavior is normal.          Assessment & Plan:  Depression/Affective disorder- PHQ-9 was 11. GAD-7 was 3. Discussed increase of lexapro of 20mg . She is unsure at this point if she wants to increase sent rx for 10mg  to take 2 tablets daily. Follow up in in next 6 months. wellbutrin refilled for 6 months. Encouraged patient to exercise and practice different relaxation techniques.   Recheck BP and looks great on recheck.    Discussed need for fasting labs. Pt also needs tdap. Declined today. Discussed will schedule for beginning of year.

## 2015-09-28 ENCOUNTER — Other Ambulatory Visit: Payer: Self-pay | Admitting: Physician Assistant

## 2015-12-14 LAB — TSH: TSH: 2.64 u[IU]/mL (ref <=5.90)

## 2015-12-14 LAB — BASIC METABOLIC PANEL
BUN: 14 mg/dL (ref 4–21)
Creatinine: 0.8 mg/dL (ref <=1.1)
GLUCOSE: 96 mg/dL
Potassium: 4.6 mmol/L (ref 3.4–5.3)
SODIUM: 142 mmol/L (ref 137–147)

## 2015-12-14 LAB — CBC AND DIFFERENTIAL
HEMATOCRIT: 45 % (ref 36–46)
HEMOGLOBIN: 15.2 g/dL (ref 12.0–16.0)
PLATELETS: 270 10*3/uL (ref 150–399)
WBC: 5.3 10*3/mL

## 2015-12-14 LAB — LIPID PANEL
HDL: 69 mg/dL (ref 35–70)
LDL CALC: 147 mg/dL
Triglycerides: 77 mg/dL (ref 40–160)

## 2015-12-14 LAB — HEPATIC FUNCTION PANEL
ALK PHOS: 85 U/L (ref 25–125)
ALT: 14 U/L (ref 7–35)
AST: 15 U/L (ref 13–35)
BILIRUBIN, TOTAL: 0.4 mg/dL

## 2015-12-14 LAB — VITAMIN D 25 HYDROXY (VIT D DEFICIENCY, FRACTURES): VIT D 25 HYDROXY: 31.1

## 2015-12-18 LAB — HM MAMMOGRAPHY

## 2016-01-02 ENCOUNTER — Encounter: Payer: Self-pay | Admitting: Physician Assistant

## 2016-02-12 ENCOUNTER — Encounter: Payer: Self-pay | Admitting: Physician Assistant

## 2016-03-23 ENCOUNTER — Encounter: Payer: BLUE CROSS/BLUE SHIELD | Admitting: Physician Assistant

## 2016-05-22 ENCOUNTER — Ambulatory Visit (INDEPENDENT_AMBULATORY_CARE_PROVIDER_SITE_OTHER): Payer: BLUE CROSS/BLUE SHIELD | Admitting: Physician Assistant

## 2016-05-22 ENCOUNTER — Encounter: Payer: Self-pay | Admitting: Physician Assistant

## 2016-05-22 VITALS — BP 111/64 | HR 84 | Ht 60.0 in | Wt 138.0 lb

## 2016-05-22 DIAGNOSIS — F339 Major depressive disorder, recurrent, unspecified: Secondary | ICD-10-CM | POA: Diagnosis not present

## 2016-05-22 DIAGNOSIS — E663 Overweight: Secondary | ICD-10-CM | POA: Insufficient documentation

## 2016-05-22 DIAGNOSIS — F39 Unspecified mood [affective] disorder: Secondary | ICD-10-CM | POA: Diagnosis not present

## 2016-05-22 DIAGNOSIS — IMO0002 Reserved for concepts with insufficient information to code with codable children: Secondary | ICD-10-CM | POA: Insufficient documentation

## 2016-05-22 MED ORDER — ALBUTEROL SULFATE HFA 108 (90 BASE) MCG/ACT IN AERS: 2.0000 | INHALATION_SPRAY | Freq: Four times a day (QID) | RESPIRATORY_TRACT | Status: DC | PRN

## 2016-05-22 MED ORDER — BUPROPION HCL ER (SR) 150 MG PO TB12: 150.0000 mg | ORAL_TABLET | Freq: Two times a day (BID) | ORAL | Status: DC

## 2016-05-22 MED ORDER — ESCITALOPRAM OXALATE 10 MG PO TABS: ORAL_TABLET | ORAL | Status: DC

## 2016-05-22 NOTE — Progress Notes (Addendum)
   Subjective:    Patient ID: Renee Burns, female    DOB: Nov 16, 1960, 55 y.o.   MRN: [id number]  HPI Pt presents to the clinic for follow up on depression and medication. She reports a little increase in anxiety but still feels stable. She denies any suicidal or homicidal thoughts. She has no other complaints.  She is due for a tetanus booster today.    Review of Systems  All other systems reviewed and are negative.      Objective:   Physical Exam  Constitutional: She is oriented to person, place, and time. She appears well-developed and well-nourished.  HENT:  Head: Normocephalic and atraumatic.  Cardiovascular: Normal rate, regular rhythm and normal heart sounds.   Pulmonary/Chest: Effort normal and breath sounds normal.  Neurological: She is alert and oriented to person, place, and time.  Psychiatric: She has a normal mood and affect. Her behavior is normal.       Assessment & Plan:  Depression/Affective disorder- PHQ-9= 6 and GAD-7= 2. Refilled lexapro and wellbutrin for 3 months.   Health Maintenance - Tetanus booster given today.

## 2016-10-29 ENCOUNTER — Emergency Department (INDEPENDENT_AMBULATORY_CARE_PROVIDER_SITE_OTHER)
Admission: EM | Admit: 2016-10-29 | Discharge: 2016-10-29 | Disposition: A | Discharge status: Home / Self Care | Payer: BLUE CROSS/BLUE SHIELD | Source: Home / Self Care | Attending: Family Medicine | Admitting: Family Medicine

## 2016-10-29 ENCOUNTER — Encounter: Payer: Self-pay | Admitting: Emergency Medicine

## 2016-10-29 DIAGNOSIS — J4541 Moderate persistent asthma with (acute) exacerbation: Secondary | ICD-10-CM

## 2016-10-29 DIAGNOSIS — B9789 Other viral agents as the cause of diseases classified elsewhere: Secondary | ICD-10-CM

## 2016-10-29 DIAGNOSIS — J9801 Acute bronchospasm: Secondary | ICD-10-CM

## 2016-10-29 DIAGNOSIS — J069 Acute upper respiratory infection, unspecified: Secondary | ICD-10-CM

## 2016-10-29 MED ORDER — BENZONATATE 200 MG PO CAPS: ORAL_CAPSULE | ORAL | 0 refills | Status: DC

## 2016-10-29 MED ORDER — DOXYCYCLINE HYCLATE 100 MG PO CAPS: 100.0000 mg | ORAL_CAPSULE | Freq: Two times a day (BID) | ORAL | 0 refills | Status: DC

## 2016-10-29 MED ORDER — PREDNISONE 20 MG PO TABS: ORAL_TABLET | ORAL | 0 refills | Status: DC

## 2016-10-29 MED ORDER — BUDESONIDE-FORMOTEROL FUMARATE 80-4.5 MCG/ACT IN AERO: 2.0000 | INHALATION_SPRAY | Freq: Two times a day (BID) | RESPIRATORY_TRACT | 1 refills | Status: DC

## 2016-10-29 NOTE — Discharge Instructions (Signed)
Take plain guaifenesin (1200mg  extended release tabs such as Mucinex) twice daily, with plenty of water, for cough and congestion.  May add Pseudoephedrine (30mg , one or two every 4 to 6 hours) for sinus congestion.  Get adequate rest.   May use Afrin nasal spray (or generic oxymetazoline) twice daily for about 5 days and then discontinue.  Also recommend using saline nasal spray several times daily and saline nasal irrigation (AYR is a common brand).  Use Flonase nasal spray each morning after using Afrin nasal spray and saline nasal irrigation. Try warm salt water gargles for sore throat.  Stop all antihistamines for now, and other non-prescription cough/cold preparations. Continue albuterol inhaler as needed. Begin Doxycycline if not improving about one week or if persistent fever develops   Follow-up with family doctor if not improving about10 days.

## 2016-10-29 NOTE — ED Triage Notes (Signed)
Pt c/o cough with fever of 101 and body aches x3 days. She has asthma and using inhaler with no relief.

## 2016-10-29 NOTE — ED Provider Notes (Signed)
[facility]    CSN: [id number] Arrival date & time: 10/29/16  1348     History   Chief Complaint Chief Complaint  Patient presents with  . Cough    HPI Renee Burns is a 55 y.o. female.   Patient has a history of asthma normally controlled with only occasional use of an albuterol inhaler.  During the past month she had had increased cough. During the past three days she has developed mild sore throat and increased cough.  During the past two days she has developed fatigue, chills, myalgias, and fever to 101.3 last night.  She has had pneumonia twice in the last 10 years.   The history is provided by the patient.    Past Medical History:  Diagnosis Date  . Anxiety   . Asthma   . Hyperlipidemia   . Ovarian cyst   . Pneumonia   . Ulcerative colitis [facility])     Patient Active Problem List   Diagnosis Date Noted  . Overweight (BMI 25.0-29.9) 05/22/2016  . H/O colostomy 05/22/2016  . Abnormal weight gain 12/07/2014  . Hypercholesteremia 06/12/2014  . Asthma, cough variant 06/12/2014  . Affective disorder (HCC) 06/12/2014  . Depression, recurrent (HCC) 06/12/2014  . H/O chronic ulcerative colitis 01/25/2013    Past Surgical History:  Procedure Laterality Date  . CESAREAN SECTION    . CHOLECYSTECTOMY    . COLOSTOMY      OB History    Gravida Para Term Preterm AB Living   2 2 1 1   2    SAB TAB Ectopic Multiple Live Births                   Home Medications    Prior to Admission medications   Medication Sig Start Date End Date Taking? Authorizing Provider  albuterol (PROAIR HFA) 108 (90 Base) MCG/ACT inhaler Inhale 2 puffs into the lungs every 6 (six) hours as needed for wheezing or shortness of breath. 05/22/16   Jade L Breeback, PA-C  aspirin 81 MG tablet Take 81 mg by mouth.    Historical Provider, MD  benzonatate (TESSALON) 200 MG capsule Take one cap by mouth at bedtime as needed for cough.  May repeat in 4 to 6 hours 10/29/16   Lattie Haw, MD  budesonide-formoterol [facility]) 80-4.5 MCG/ACT inhaler Inhale 2 puffs into the lungs 2 (two) times daily. 10/29/16   Lattie Haw, MD  buPROPion (WELLBUTRIN SR) 150 MG 12 hr tablet Take 1 tablet (150 mg total) by mouth 2 (two) times daily. 05/22/16   Jade L Breeback, PA-C  doxycycline (VIBRAMYCIN) 100 MG capsule Take 1 capsule (100 mg total) by mouth 2 (two) times daily. Take with food (Rx void after 11/06/16) 10/29/16   Lattie Haw, MD  escitalopram (LEXAPRO) 10 MG tablet Take 2 tablets daily. 09/23/15   Jade L Breeback, PA-C  escitalopram (LEXAPRO) 10 MG tablet Take 2 tablets daily. 05/22/16   Jade L Breeback, PA-C  Multiple Minerals-Vitamins (CALCIUM CITRATE +) TABS Take by mouth daily.    Historical Provider, MD  Multiple Vitamins-Minerals (WOMENS MULTIVITAMIN PLUS) TABS Take 1 tablet by mouth.    Historical Provider, MD  predniSONE (DELTASONE) 20 MG tablet Take one tab by mouth twice daily for 5 days, then one daily. Take with food. 10/29/16   Lattie Haw, MD    Family History Family History  Problem Relation Age of Onset  . Heart disease Father   . Cancer Father  bladder CA  . Diabetes Mother     Social History Social History  Substance Use Topics  . Smoking status: Never Smoker  . Smokeless tobacco: Never Used  . Alcohol use No     Allergies   Augmentin [amoxicillin-pot clavulanate] and Loracarbef   Review of Systems Review of Systems  + sore throat + cough No pleuritic pain + wheezing + nasal congestion + post-nasal drainage No sinus pain/pressure No itchy/red eyes No earache No hemoptysis + SOB + fever, + chills No nausea No vomiting No abdominal pain No diarrhea No urinary symptoms No skin rash + fatigue + myalgias No headache Used OTC meds without relief    Physical Exam Triage Vital Signs ED Triage Vitals  Enc Vitals Group     BP 10/29/16 1529 109/77     Pulse Rate 10/29/16 1529 108     Resp --      Temp 10/29/16  1529 98.3 F (36.8 C)     Temp Source 10/29/16 1529 Oral     SpO2 10/29/16 1531 92 %     Weight 10/29/16 1532 130 lb (59 kg)     Height --      Head Circumference --      Peak Flow --      Pain Score 10/29/16 1533 0     Pain Loc --      Pain Edu? --      Excl. in GC? --    No data found.   Updated Vital Signs BP 109/77 (BP Location: Right Arm)   Pulse 108   Temp 98.3 F (36.8 C) (Oral)   Wt 130 lb (59 kg)   LMP 08/12/2012   SpO2 92%   BMI 25.39 kg/m   Visual Acuity Right Eye Distance:   Left Eye Distance:   Bilateral Distance:    Right Eye Near:   Left Eye Near:    Bilateral Near:     Physical Exam Nursing notes and Vital Signs reviewed. Appearance:  Patient appears stated age, and in no acute distress Eyes:  Pupils are equal, round, and reactive to light and accomodation.  Extraocular movement is intact.  Conjunctivae are not inflamed  Ears:  Canals normal.  Tympanic membranes normal.  Nose:  Mildly congested turbinates.  No sinus tenderness.   Pharynx:  Normal Neck:  Supple.  Tender enlarged posterior/lateral nodes are palpated bilaterally  Lungs:  Clear to auscultation.  Breath sounds are equal.  Moving air well. Heart:  Regular rate and rhythm without murmurs, rubs, or gallops.  Abdomen:  Nontender without masses or hepatosplenomegaly.  Bowel sounds are present.  No CVA or flank tenderness.  Extremities:  No edema.  Skin:  No rash present.    UC Treatments / Results  Labs (all labs ordered are listed, but only abnormal results are displayed) Labs Reviewed - No data to display  EKG  EKG Interpretation None       Radiology No results found.  Procedures Procedures (including critical care time)  Medications Ordered in UC Medications - No data to display   Initial Impression / Assessment and Plan / UC Course  I have reviewed the triage vital signs and the nursing notes.  Pertinent labs & imaging results that were available during my care of  the patient were reviewed by me and considered in my medical decision making (see chart for details).  Clinical Course   There is no evidence of bacterial infection today.   Begin prednisone burst/taper.  Begin Symbicort for improved asthma control. Prescription written for Benzonatate [facility](Tessalon) to take at bedtime for night-time cough.  Take plain guaifenesin (1200mg  extended release tabs such as Mucinex) twice daily, with plenty of water, for cough and congestion.  May add Pseudoephedrine (30mg , one or two every 4 to 6 hours) for sinus congestion.  Get adequate rest.   May use Afrin nasal spray (or generic oxymetazoline) twice daily for about 5 days and then discontinue.  Also recommend using saline nasal spray several times daily and saline nasal irrigation (AYR is a common brand).  Use Flonase nasal spray each morning after using Afrin nasal spray and saline nasal irrigation. Try warm salt water gargles for sore throat.  Stop all antihistamines for now, and other non-prescription cough/cold preparations. Continue albuterol inhaler as needed. Begin Doxycycline if not improving about one week or if persistent fever develops   Follow-up with family doctor if not improving about10 days.     Final Clinical Impressions(s) / UC Diagnoses   Final diagnoses:  Viral URI with cough  Bronchospasm  Moderate persistent asthma with acute exacerbation    New Prescriptions New Prescriptions   BENZONATATE (TESSALON) 200 MG CAPSULE    Take one cap by mouth at bedtime as needed for cough.  May repeat in 4 to 6 hours   BUDESONIDE-FORMOTEROL (SYMBICORT) 80-4.5 MCG/ACT INHALER    Inhale 2 puffs into the lungs 2 (two) times daily.   DOXYCYCLINE (VIBRAMYCIN) 100 MG CAPSULE    Take 1 capsule (100 mg total) by mouth 2 (two) times daily. Take with food (Rx void after 11/06/16)   PREDNISONE (DELTASONE) 20 MG TABLET    Take one tab by mouth twice daily for 5 days, then one daily. Take with food.     Lattie HawStephen A  [place] Valli, MD 11/15/16 1352

## 2017-01-13 ENCOUNTER — Ambulatory Visit (INDEPENDENT_AMBULATORY_CARE_PROVIDER_SITE_OTHER): Payer: BLUE CROSS/BLUE SHIELD

## 2017-01-13 ENCOUNTER — Telehealth: Payer: Self-pay | Admitting: *Deleted

## 2017-01-13 ENCOUNTER — Encounter: Payer: Self-pay | Admitting: Physician Assistant

## 2017-01-13 ENCOUNTER — Ambulatory Visit (INDEPENDENT_AMBULATORY_CARE_PROVIDER_SITE_OTHER): Payer: BLUE CROSS/BLUE SHIELD | Admitting: Physician Assistant

## 2017-01-13 VITALS — BP 118/72 | HR 90 | Ht 60.0 in | Wt 137.0 lb

## 2017-01-13 DIAGNOSIS — R059 Cough, unspecified: Secondary | ICD-10-CM

## 2017-01-13 DIAGNOSIS — J45991 Cough variant asthma: Secondary | ICD-10-CM | POA: Diagnosis not present

## 2017-01-13 DIAGNOSIS — R05 Cough: Secondary | ICD-10-CM

## 2017-01-13 MED ORDER — FLUTICASONE PROPIONATE HFA 44 MCG/ACT IN AERO: 2.0000 | INHALATION_SPRAY | Freq: Two times a day (BID) | RESPIRATORY_TRACT | 6 refills | Status: DC

## 2017-01-13 MED ORDER — PREDNISONE 20 MG PO TABS: ORAL_TABLET | ORAL | 0 refills | Status: DC

## 2017-01-13 MED ORDER — MONTELUKAST SODIUM 10 MG PO TABS: 10.0000 mg | ORAL_TABLET | Freq: Every day | ORAL | 5 refills | Status: DC

## 2017-01-13 NOTE — Patient Instructions (Signed)
Upper airway cough syndrome.

## 2017-01-13 NOTE — Telephone Encounter (Signed)
Pulmonology referral placed.

## 2017-01-13 NOTE — Progress Notes (Signed)
   Subjective:    Patient ID: Renee Burns, female    DOB: 12-Oct-1961, 56 y.o.   MRN: [id number]  HPI  Pt is a 56 yo female who presents to the clinic with persistent cough since September 2001. She remembers getting in a hot tub and breathing in strong chlorine. She has coughed since. She has been to numerous doctors. She was told she had asthma after pulmonary function test done at another hospital. She was put on flovent for a while which helped some. She went off and symptoms were worse. She has been treated for GERD and had EGD which confirmed she did not have GERD. Treatment with nexium did not help. She feels like getting worse. She was seen in UC and given symbicort recently but seemed to make it worse. At times it takes 10 puffs of albuterol to get relief. Cough is dry. No fever, chills, body aches. She felt the best and cough was almost gone when she was on prednisone for extended period.    Review of Systems    see HPI.  Objective:   Physical Exam  Constitutional: She is oriented to person, place, and time. She appears well-developed and well-nourished.  HENT:  Head: Normocephalic and atraumatic.  Cardiovascular: Normal rate, regular rhythm and normal heart sounds.   Pulmonary/Chest: Effort normal and breath sounds normal. She has no wheezes.  Neurological: She is alert and oriented to person, place, and time.  Psychiatric: She has a normal mood and affect. Her behavior is normal.          Assessment & Plan:  Marland Kitchen.Marland Kitchen.Renee Burns was seen today for cough.  Diagnoses and all orders for this visit:  Cough -     DG Chest 2 View -     predniSONE (DELTASONE) 20 MG tablet; Take 3 tablets for 3 days, take 2 tablets for 3 days, take 1 tablet for 3 days, take 1/2 tablet for 4 days. -     fluticasone (FLOVENT HFA) 44 MCG/ACT inhaler; Inhale 2 puffs into the lungs 2 (two) times daily. -     montelukast (SINGULAIR) 10 MG tablet; Take 1 tablet (10 mg total) by mouth at bedtime.  Asthma,  cough variant -     predniSONE (DELTASONE) 20 MG tablet; Take 3 tablets for 3 days, take 2 tablets for 3 days, take 1 tablet for 3 days, take 1/2 tablet for 4 days. -     fluticasone (FLOVENT HFA) 44 MCG/ACT inhaler; Inhale 2 puffs into the lungs 2 (two) times daily. -     montelukast (SINGULAIR) 10 MG tablet; Take 1 tablet (10 mg total) by mouth at bedtime.   Peak flows all in green. Discussed this does not support asthma dx.   CXR-Mediastinum hilar structures normal. Lungs are clear. No pleural effusion or pneumothorax. Heart size normal. Biapical pleural-parenchymal thickening consistent with scarring.  No acute process but concerning for some restrictive lung disease.  pulmonology referral made.   Prednisone taper started. Restarted flovent since helped originally.consider singulair to help with any allergic cause of cough.

## 2017-02-10 DIAGNOSIS — R938 Abnormal findings on diagnostic imaging of other specified body structures: Secondary | ICD-10-CM | POA: Diagnosis not present

## 2017-02-10 DIAGNOSIS — J45991 Cough variant asthma: Secondary | ICD-10-CM | POA: Diagnosis not present

## 2017-02-10 DIAGNOSIS — J3089 Other allergic rhinitis: Secondary | ICD-10-CM | POA: Diagnosis not present

## 2017-02-10 DIAGNOSIS — J45909 Unspecified asthma, uncomplicated: Secondary | ICD-10-CM | POA: Diagnosis not present

## 2017-02-24 DIAGNOSIS — R918 Other nonspecific abnormal finding of lung field: Secondary | ICD-10-CM | POA: Diagnosis not present

## 2017-02-24 DIAGNOSIS — R938 Abnormal findings on diagnostic imaging of other specified body structures: Secondary | ICD-10-CM | POA: Diagnosis not present

## 2017-04-13 DIAGNOSIS — Z01419 Encounter for gynecological examination (general) (routine) without abnormal findings: Secondary | ICD-10-CM | POA: Diagnosis not present

## 2017-04-13 DIAGNOSIS — Z6827 Body mass index (BMI) 27.0-27.9, adult: Secondary | ICD-10-CM | POA: Diagnosis not present

## 2017-05-31 ENCOUNTER — Other Ambulatory Visit: Payer: Self-pay | Admitting: Physician Assistant

## 2017-07-13 ENCOUNTER — Other Ambulatory Visit: Payer: Self-pay | Admitting: Physician Assistant

## 2017-08-09 ENCOUNTER — Other Ambulatory Visit: Payer: Self-pay | Admitting: Physician Assistant

## 2017-08-28 ENCOUNTER — Other Ambulatory Visit: Payer: Self-pay | Admitting: Sports Medicine

## 2017-09-02 ENCOUNTER — Other Ambulatory Visit: Payer: Self-pay | Admitting: Physician Assistant

## 2017-10-08 ENCOUNTER — Other Ambulatory Visit: Payer: Self-pay | Admitting: Physician Assistant

## 2017-10-29 ENCOUNTER — Other Ambulatory Visit: Payer: Self-pay | Admitting: Physician Assistant

## 2017-11-05 ENCOUNTER — Ambulatory Visit (INDEPENDENT_AMBULATORY_CARE_PROVIDER_SITE_OTHER): Payer: BLUE CROSS/BLUE SHIELD | Admitting: Physician Assistant

## 2017-11-05 ENCOUNTER — Ambulatory Visit (INDEPENDENT_AMBULATORY_CARE_PROVIDER_SITE_OTHER): Payer: BLUE CROSS/BLUE SHIELD

## 2017-11-05 ENCOUNTER — Encounter: Payer: Self-pay | Admitting: Physician Assistant

## 2017-11-05 VITALS — BP 116/68 | HR 102 | Ht 60.0 in | Wt 161.0 lb

## 2017-11-05 DIAGNOSIS — Z131 Encounter for screening for diabetes mellitus: Secondary | ICD-10-CM | POA: Diagnosis not present

## 2017-11-05 DIAGNOSIS — Z6831 Body mass index (BMI) 31.0-31.9, adult: Secondary | ICD-10-CM

## 2017-11-05 DIAGNOSIS — Z0000 Encounter for general adult medical examination without abnormal findings: Secondary | ICD-10-CM | POA: Diagnosis not present

## 2017-11-05 DIAGNOSIS — J45991 Cough variant asthma: Secondary | ICD-10-CM

## 2017-11-05 DIAGNOSIS — Z13220 Encounter for screening for lipoid disorders: Secondary | ICD-10-CM | POA: Diagnosis not present

## 2017-11-05 DIAGNOSIS — F419 Anxiety disorder, unspecified: Secondary | ICD-10-CM

## 2017-11-05 DIAGNOSIS — Z23 Encounter for immunization: Secondary | ICD-10-CM | POA: Diagnosis not present

## 2017-11-05 DIAGNOSIS — Z1231 Encounter for screening mammogram for malignant neoplasm of breast: Secondary | ICD-10-CM | POA: Diagnosis not present

## 2017-11-05 DIAGNOSIS — E7800 Pure hypercholesterolemia, unspecified: Secondary | ICD-10-CM | POA: Diagnosis not present

## 2017-11-05 DIAGNOSIS — F339 Major depressive disorder, recurrent, unspecified: Secondary | ICD-10-CM

## 2017-11-05 DIAGNOSIS — H3530 Unspecified macular degeneration: Secondary | ICD-10-CM | POA: Insufficient documentation

## 2017-11-05 DIAGNOSIS — E6609 Other obesity due to excess calories: Secondary | ICD-10-CM | POA: Diagnosis not present

## 2017-11-05 MED ORDER — BUPROPION HCL ER (SR) 150 MG PO TB12: 150.0000 mg | ORAL_TABLET | Freq: Two times a day (BID) | ORAL | 3 refills | Status: DC

## 2017-11-05 MED ORDER — LORCASERIN HCL ER 20 MG PO TB24: 1.0000 | ORAL_TABLET | Freq: Every day | ORAL | 2 refills | Status: DC

## 2017-11-05 MED ORDER — FLUTICASONE PROPIONATE HFA 44 MCG/ACT IN AERO: 2.0000 | INHALATION_SPRAY | Freq: Two times a day (BID) | RESPIRATORY_TRACT | 6 refills | Status: DC

## 2017-11-05 MED ORDER — ESCITALOPRAM OXALATE 10 MG PO TABS: 10.0000 mg | ORAL_TABLET | Freq: Every day | ORAL | 3 refills | Status: DC

## 2017-11-05 NOTE — Patient Instructions (Signed)

## 2017-11-05 NOTE — Progress Notes (Signed)
Subjective:     Renee Burns is a 57 y.o. female and is here for a comprehensive physical exam. The patient reports problems - she mentions her anxiety has worsened. at times she as episodes of pending doom. she is taking lexpro and wellbutrin. she has been on this for a while. no SI/HC. Marland Kitchen.   Social History   Socioeconomic History  . Marital status: Married    Spouse name: Not on file  . Number of children: Not on file  . Years of education: Not on file  . Highest education level: Not on file  Social Needs  . Financial resource strain: Not on file  . Food insecurity - worry: Not on file  . Food insecurity - inability: Not on file  . Transportation needs - medical: Not on file  . Transportation needs - non-medical: Not on file  Occupational History  . Occupation: Paediatric nursebarber  Tobacco Use  . Smoking status: Never Smoker  . Smokeless tobacco: Never Used  Substance and Sexual Activity  . Alcohol use: No  . Drug use: No  . Sexual activity: Yes  Other Topics Concern  . Not on file  Social History Narrative  . Not on file   Health Maintenance  Topic Date Due  . Hepatitis C Screening  03-06-1961  . HIV Screening  04/26/1975  . PAP SMEAR  06/12/2017  . COLONOSCOPY  01/01/2065 (Originally 04/25/2010)  . MAMMOGRAM  11/06/2019  . TETANUS/TDAP  11/02/2025  . INFLUENZA VACCINE  Completed    The following portions of the patient's history were reviewed and updated as appropriate: allergies, current medications, past family history, past medical history, past social history, past surgical history and problem list.  Review of Systems A comprehensive review of systems was negative.   Objective:    BP 116/68   Pulse (!) 102   Ht 5' (1.524 m)   Wt 161 lb (73 kg)   LMP 08/12/2012   BMI 31.44 kg/m  General appearance: alert, cooperative, appears stated age and mildly obese Head: Normocephalic, without obvious abnormality, atraumatic Eyes: conjunctivae/corneas clear. PERRL, EOM's  intact. Fundi benign. Ears: normal TM's and external ear canals both ears Nose: Nares normal. Septum midline. Mucosa normal. No drainage or sinus tenderness. Throat: lips, mucosa, and tongue normal; teeth and gums normal Neck: no adenopathy, no carotid bruit, no JVD, supple, symmetrical, trachea midline and thyroid not enlarged, symmetric, no tenderness/mass/nodules Back: symmetric, no curvature. ROM normal. No CVA tenderness. Lungs: clear to auscultation bilaterally Heart: regular rate and rhythm, S1, S2 normal, no murmur, click, rub or gallop Abdomen: soft, non-tender; bowel sounds normal; no masses,  no organomegaly Extremities: extremities normal, atraumatic, no cyanosis or edema Pulses: 2+ and symmetric Skin: Skin color, texture, turgor normal. No rashes or lesions Lymph nodes: Cervical, supraclavicular, and axillary nodes normal. Neurologic: Alert and oriented X 3, normal strength and tone. Normal symmetric reflexes. Normal coordination and gait    Assessment:    Healthy female exam.      Plan:     ...Renee Burns was seen today for annual exam.  Diagnoses and all orders for this visit:  Routine physical examination -     Lipid Panel w/reflex Direct LDL -     COMPLETE METABOLIC PANEL WITH GFR -     TSH -     B12 -     Vitamin D 1,25 dihydroxy -     CBC with Differential/Platelet  Influenza vaccine needed -     Flu Vaccine QUAD  6+ mos PF IM (Fluarix Quad PF)  Asthma, cough variant -     fluticasone (FLOVENT HFA) 44 MCG/ACT inhaler; Inhale 2 puffs into the lungs 2 (two) times daily.  Screening for lipid disorders -     Lipid Panel w/reflex Direct LDL  Screening for diabetes mellitus -     COMPLETE METABOLIC PANEL WITH GFR  Visit for screening mammogram -     MM SCREENING BREAST TOMO BILATERAL  Class 1 obesity due to excess calories without serious comorbidity with body mass index (BMI) of 31.0 to 31.9 in adult -     Lorcaserin HCl ER (BELVIQ XR) 20 MG TB24; Take 1  tablet by mouth daily.  Depression, recurrent (HCC) -     escitalopram (LEXAPRO) 10 MG tablet; Take 1 tablet (10 mg total) by mouth daily. -     buPROPion (WELLBUTRIN SR) 150 MG 12 hr tablet; Take 1 tablet (150 mg total) by mouth 2 (two) times daily.  Anxiety -     escitalopram (LEXAPRO) 10 MG tablet; Take 1 tablet (10 mg total) by mouth daily. -     buPROPion (WELLBUTRIN SR) 150 MG 12 hr tablet; Take 1 tablet (150 mg total) by mouth 2 (two) times daily.   .. Depression screen [facility] 2/9 11/05/2017 05/22/2016  Decreased Interest 2 0  Down, Depressed, Hopeless 2 0  PHQ - 2 Score 4 0  Altered sleeping 1 -  Tired, decreased energy 2 -  Change in appetite 2 -  Feeling bad or failure about yourself  1 -  Trouble concentrating 1 -  Moving slowly or fidgety/restless 0 -  Suicidal thoughts 0 -  PHQ-9 Score 11 -   .Marland Kitchen GAD 7 : Generalized Anxiety Score 11/05/2017 05/22/2016  Nervous, Anxious, on Edge 1 1  Control/stop worrying 1 0  Worry too much - different things 1 0  Trouble relaxing 0 0  Restless 0 0  Easily annoyed or irritable 1 1  Afraid - awful might happen 1 0  Total GAD 7 Score 5 2   Fasting labs ordered.  Pap up to date from [facility]. Will call and get.  Mammogram ordered.  Discussed shingles vaccine. HO given. Pt will consider.  Consider ASA baby daily.   Discussed options for increased anxiety. Right now patient will work on coping skills, increasing exercise, working on medication and self control. No medication changes today.   .. Discussed 150 minutes of exercise a week.  Encouraged vitamin D 1000 units and Calcium 1300mg  or 4 servings of dairy a day.   Marland Kitchen.Discussed low carb diet with 1500 calories and 80g of protein.  Exercising at least 150 minutes a week.  My Fitness Pal could be a Chief Technology Officer.  Pt has tried and failed phentermine with not keeping the weight off. She has actively tried diet and exercise for over 6 months.  Sent belviq. Discussed side effects.   Follow up in 3 months.  137-161 See After Visit Summary for Counseling Recommendations

## 2017-11-06 ENCOUNTER — Encounter: Payer: Self-pay | Admitting: Physician Assistant

## 2017-11-06 ENCOUNTER — Telehealth: Payer: Self-pay | Admitting: Physician Assistant

## 2017-11-06 DIAGNOSIS — F419 Anxiety disorder, unspecified: Secondary | ICD-10-CM | POA: Insufficient documentation

## 2017-11-06 DIAGNOSIS — E6609 Other obesity due to excess calories: Secondary | ICD-10-CM | POA: Insufficient documentation

## 2017-11-06 DIAGNOSIS — Z6831 Body mass index (BMI) 31.0-31.9, adult: Secondary | ICD-10-CM

## 2017-11-06 NOTE — Telephone Encounter (Signed)
Please call for pap from [facility].

## 2017-11-08 NOTE — Telephone Encounter (Signed)
Fax request placed with front office. 

## 2017-11-11 LAB — COMPLETE METABOLIC PANEL WITH GFR
AG RATIO: 1.8 (calc) (ref 1.0–2.5)
ALT: 40 U/L — AB (ref 6–29)
AST: 30 U/L (ref 10–35)
Albumin: 4.4 g/dL (ref 3.6–5.1)
Alkaline phosphatase (APISO): 75 U/L (ref 33–130)
BILIRUBIN TOTAL: 0.6 mg/dL (ref 0.2–1.2)
BUN: 13 mg/dL (ref 7–25)
CALCIUM: 9.4 mg/dL (ref 8.6–10.4)
CHLORIDE: 104 mmol/L (ref 98–110)
CO2: 26 mmol/L (ref 20–32)
Creat: 0.77 mg/dL (ref 0.50–1.05)
GFR, EST NON AFRICAN AMERICAN: 86 mL/min/{1.73_m2} (ref >=60)
GFR, Est African American: 99 mL/min/{1.73_m2} (ref >=60)
GLOBULIN: 2.5 g/dL (ref 1.9–3.7)
Glucose, Bld: 99 mg/dL (ref 65–99)
POTASSIUM: 4.2 mmol/L (ref 3.5–5.3)
SODIUM: 137 mmol/L (ref 135–146)
Total Protein: 6.9 g/dL (ref 6.1–8.1)

## 2017-11-11 LAB — VITAMIN B12: Vitamin B-12: 587 pg/mL (ref 200–1100)

## 2017-11-11 LAB — CBC WITH DIFFERENTIAL/PLATELET
Basophils Absolute: 40 cells/uL (ref 0–200)
Basophils Relative: 0.6 %
Eosinophils Absolute: 67 cells/uL (ref 15–500)
Eosinophils Relative: 1 %
HCT: 37.5 % (ref 35.0–45.0)
Hemoglobin: 12.8 g/dL (ref 11.7–15.5)
Lymphs Abs: 1561 cells/uL (ref 850–3900)
MCH: 28.8 pg (ref 27.0–33.0)
MCHC: 34.1 g/dL (ref 32.0–36.0)
MCV: 84.3 fL (ref 80.0–100.0)
MONOS PCT: 6.6 %
MPV: 10.7 fL (ref 7.5–12.5)
NEUTROS PCT: 68.5 %
Neutro Abs: 4590 cells/uL (ref 1500–7800)
PLATELETS: 298 10*3/uL (ref 140–400)
RBC: 4.45 10*6/uL (ref 3.80–5.10)
RDW: 12.9 % (ref 11.0–15.0)
TOTAL LYMPHOCYTE: 23.3 %
WBC: 6.7 10*3/uL (ref 3.8–10.8)
WBCMIX: 442 {cells}/uL (ref 200–950)

## 2017-11-11 LAB — LIPID PANEL W/REFLEX DIRECT LDL
CHOL/HDL RATIO: 3.5 (calc) (ref <5.0)
CHOLESTEROL: 280 mg/dL — AB (ref <200)
HDL: 80 mg/dL (ref >50)
LDL CHOLESTEROL (CALC): 176 mg/dL — AB
Non-HDL Cholesterol (Calc): 200 mg/dL (calc) — ABNORMAL HIGH (ref <130)
TRIGLYCERIDES: 117 mg/dL (ref <150)

## 2017-11-11 LAB — VITAMIN D 1,25 DIHYDROXY
VITAMIN D 1, 25 (OH) TOTAL: 27 pg/mL (ref 18–72)
VITAMIN D3 1, 25 (OH): 27 pg/mL
Vitamin D2 1, 25 (OH)2: <8 pg/mL

## 2017-11-11 LAB — TSH: TSH: 1.52 mIU/L (ref 0.40–4.50)

## 2017-11-18 ENCOUNTER — Telehealth: Payer: Self-pay | Admitting: Physician Assistant

## 2017-11-18 DIAGNOSIS — R748 Abnormal levels of other serum enzymes: Secondary | ICD-10-CM

## 2017-11-18 NOTE — Telephone Encounter (Signed)
Patient called request to have lab order put in to have liver enzymes recheck'd. Thanks

## 2017-11-19 DIAGNOSIS — R748 Abnormal levels of other serum enzymes: Secondary | ICD-10-CM | POA: Diagnosis not present

## 2017-11-19 LAB — COMPLETE METABOLIC PANEL WITH GFR
AG Ratio: 2 (calc) (ref 1.0–2.5)
ALT: 19 U/L (ref 6–29)
AST: 20 U/L (ref 10–35)
Albumin: 4.7 g/dL (ref 3.6–5.1)
Alkaline phosphatase (APISO): 72 U/L (ref 33–130)
BUN: 15 mg/dL (ref 7–25)
CALCIUM: 9.8 mg/dL (ref 8.6–10.4)
CO2: 26 mmol/L (ref 20–32)
CREATININE: 0.87 mg/dL (ref 0.50–1.05)
Chloride: 108 mmol/L (ref 98–110)
GFR, EST AFRICAN AMERICAN: 86 mL/min/{1.73_m2} (ref >=60)
GFR, EST NON AFRICAN AMERICAN: 74 mL/min/{1.73_m2} (ref >=60)
GLUCOSE: 97 mg/dL (ref 65–99)
Globulin: 2.3 g/dL (calc) (ref 1.9–3.7)
Potassium: 4.6 mmol/L (ref 3.5–5.3)
Sodium: 142 mmol/L (ref 135–146)
TOTAL PROTEIN: 7 g/dL (ref 6.1–8.1)
Total Bilirubin: 0.7 mg/dL (ref 0.2–1.2)

## 2017-11-24 NOTE — Telephone Encounter (Signed)
Labs were completed on 11/19/17.

## 2018-01-10 ENCOUNTER — Other Ambulatory Visit: Payer: Self-pay | Admitting: Physician Assistant

## 2018-02-04 ENCOUNTER — Ambulatory Visit: Payer: BLUE CROSS/BLUE SHIELD | Admitting: Physician Assistant

## 2018-10-11 ENCOUNTER — Other Ambulatory Visit: Payer: Self-pay | Admitting: Physician Assistant

## 2018-10-11 DIAGNOSIS — F339 Major depressive disorder, recurrent, unspecified: Secondary | ICD-10-CM

## 2018-10-11 DIAGNOSIS — F419 Anxiety disorder, unspecified: Secondary | ICD-10-CM

## 2018-11-24 ENCOUNTER — Encounter: Payer: Self-pay | Admitting: Physician Assistant

## 2018-11-24 ENCOUNTER — Other Ambulatory Visit: Payer: Self-pay | Admitting: Physician Assistant

## 2018-11-24 ENCOUNTER — Ambulatory Visit (INDEPENDENT_AMBULATORY_CARE_PROVIDER_SITE_OTHER): Payer: BLUE CROSS/BLUE SHIELD | Admitting: Physician Assistant

## 2018-11-24 VITALS — BP 137/86 | HR 83 | Ht 60.0 in | Wt 167.0 lb

## 2018-11-24 DIAGNOSIS — F419 Anxiety disorder, unspecified: Secondary | ICD-10-CM

## 2018-11-24 DIAGNOSIS — Z13220 Encounter for screening for lipoid disorders: Secondary | ICD-10-CM

## 2018-11-24 DIAGNOSIS — Z1159 Encounter for screening for other viral diseases: Secondary | ICD-10-CM | POA: Diagnosis not present

## 2018-11-24 DIAGNOSIS — J45991 Cough variant asthma: Secondary | ICD-10-CM | POA: Diagnosis not present

## 2018-11-24 DIAGNOSIS — Z1231 Encounter for screening mammogram for malignant neoplasm of breast: Secondary | ICD-10-CM

## 2018-11-24 DIAGNOSIS — Z0000 Encounter for general adult medical examination without abnormal findings: Secondary | ICD-10-CM | POA: Diagnosis not present

## 2018-11-24 DIAGNOSIS — F339 Major depressive disorder, recurrent, unspecified: Secondary | ICD-10-CM | POA: Diagnosis not present

## 2018-11-24 DIAGNOSIS — Z131 Encounter for screening for diabetes mellitus: Secondary | ICD-10-CM | POA: Diagnosis not present

## 2018-11-24 MED ORDER — ALBUTEROL SULFATE HFA 108 (90 BASE) MCG/ACT IN AERS: 2.0000 | INHALATION_SPRAY | Freq: Four times a day (QID) | RESPIRATORY_TRACT | 2 refills | Status: DC | PRN

## 2018-11-24 MED ORDER — FLUTICASONE PROPIONATE HFA 44 MCG/ACT IN AERO: 2.0000 | INHALATION_SPRAY | Freq: Two times a day (BID) | RESPIRATORY_TRACT | 11 refills | Status: DC

## 2018-11-24 MED ORDER — BUPROPION HCL ER (SR) 150 MG PO TB12: 150.0000 mg | ORAL_TABLET | Freq: Two times a day (BID) | ORAL | 4 refills | Status: DC

## 2018-11-24 MED ORDER — ESCITALOPRAM OXALATE 10 MG PO TABS: 10.0000 mg | ORAL_TABLET | Freq: Every day | ORAL | 4 refills | Status: DC

## 2018-11-24 NOTE — Progress Notes (Signed)
l Subjective:     Renee Burns is a 58 y.o. female and is here for a comprehensive physical exam. The patient reports problems - she would like to lose weight. she has been up and down for years with her weight. she loses and gains. she has her 30 year wedding aniversary and wants to fit in her wedding dress. .  Social History   Socioeconomic History  . Marital status: Married    Spouse name: Not on file  . Number of children: Not on file  . Years of education: Not on file  . Highest education level: Not on file  Occupational History  . Occupation: Paediatric nursebarber  Social Needs  . Financial resource strain: Not on file  . Food insecurity:    Worry: Not on file    Inability: Not on file  . Transportation needs:    Medical: Not on file    Non-medical: Not on file  Tobacco Use  . Smoking status: Never Smoker  . Smokeless tobacco: Never Used  Substance and Sexual Activity  . Alcohol use: No  . Drug use: No  . Sexual activity: Yes  Lifestyle  . Physical activity:    Days per week: Not on file    Minutes per session: Not on file  . Stress: Not on file  Relationships  . Social connections:    Talks on phone: Not on file    Gets together: Not on file    Attends religious service: Not on file    Active member of club or organization: Not on file    Attends meetings of clubs or organizations: Not on file    Relationship status: Not on file  . Intimate partner violence:    Fear of current or ex partner: Not on file    Emotionally abused: Not on file    Physically abused: Not on file    Forced sexual activity: Not on file  Other Topics Concern  . Not on file  Social History Narrative  . Not on file   Health Maintenance  Topic Date Due  . Hepatitis C Screening  22-May-1961  . PAP SMEAR-Modifier  06/12/2017  . INFLUENZA VACCINE  01/31/2019 (Originally 06/02/2018)  . HIV Screening  11/26/2019 (Originally 04/26/1975)  . COLONOSCOPY  01/01/2065 (Originally 04/25/2010)  . MAMMOGRAM   11/06/2019  . TETANUS/TDAP  11/02/2025    The following portions of the patient's history were reviewed and updated as appropriate: allergies, current medications, past family history, past medical history, past social history, past surgical history and problem list.  Review of Systems A comprehensive review of systems was negative.   Objective:    BP 137/86   Pulse 83   Ht 5' (1.524 m)   Wt 167 lb (75.8 kg)   LMP 08/12/2012   BMI 32.61 kg/m  General appearance: alert, cooperative and appears stated age Head: Normocephalic, without obvious abnormality, atraumatic Eyes: conjunctivae/corneas clear. PERRL, EOM's intact. Fundi benign. Ears: normal TM's and external ear canals both ears Nose: Nares normal. Septum midline. Mucosa normal. No drainage or sinus tenderness. Throat: lips, mucosa, and tongue normal; teeth and gums normal Neck: no adenopathy, no carotid bruit, no JVD, supple, symmetrical, trachea midline and thyroid not enlarged, symmetric, no tenderness/mass/nodules Back: symmetric, no curvature. ROM normal. No CVA tenderness. Lungs: clear to auscultation bilaterally Heart: regular rate and rhythm, S1, S2 normal, no murmur, click, rub or gallop Abdomen: soft, non-tender; bowel sounds normal; no masses,  no organomegaly Extremities: extremities normal, atraumatic,  no cyanosis or edema Pulses: 2+ and symmetric Skin: Skin color, texture, turgor normal. No rashes or lesions Lymph nodes: Cervical, supraclavicular, and axillary nodes normal. Neurologic: Alert and oriented X 3, normal strength and tone. Normal symmetric reflexes. Normal coordination and gait    Assessment:    Healthy female exam.       Plan:    Marland KitchenMarland KitchenRonit was seen today for annual exam.  Diagnoses and all orders for this visit:  Routine physical examination -     Hepatitis C Antibody -     COMPLETE METABOLIC PANEL WITH GFR -     Lipid Panel w/reflex Direct LDL -     VITAMIN D 25 Hydroxy (Vit-D  Deficiency, Fractures)  Depression, recurrent (HCC) -     escitalopram (LEXAPRO) 10 MG tablet; Take 1 tablet (10 mg total) by mouth daily. -     buPROPion (WELLBUTRIN SR) 150 MG 12 hr tablet; Take 1 tablet (150 mg total) by mouth 2 (two) times daily.  Anxiety -     escitalopram (LEXAPRO) 10 MG tablet; Take 1 tablet (10 mg total) by mouth daily. -     buPROPion (WELLBUTRIN SR) 150 MG 12 hr tablet; Take 1 tablet (150 mg total) by mouth 2 (two) times daily.  Asthma, cough variant -     fluticasone (FLOVENT HFA) 44 MCG/ACT inhaler; Inhale 2 puffs into the lungs 2 (two) times daily.  Need for hepatitis C screening test -     Hepatitis C Antibody  Screening for lipid disorders -     COMPLETE METABOLIC PANEL WITH GFR  Screening for diabetes mellitus -     Lipid Panel w/reflex Direct LDL  Other orders -     albuterol (PROVENTIL HFA;VENTOLIN HFA) 108 (90 Base) MCG/ACT inhaler; Inhale 2 puffs into the lungs every 6 (six) hours as needed for wheezing or shortness of breath.   .. Depression screen [facility] 2/9 11/24/2018 11/05/2017 05/22/2016  Decreased Interest 1 2 0  Down, Depressed, Hopeless 1 2 0  PHQ - 2 Score 2 4 0  Altered sleeping 1 1 -  Tired, decreased energy 1 2 -  Change in appetite 2 2 -  Feeling bad or failure about yourself  2 1 -  Trouble concentrating 1 1 -  Moving slowly or fidgety/restless 0 0 -  Suicidal thoughts 0 0 -  PHQ-9 Score 9 11 -  Difficult doing work/chores Somewhat difficult - -   .Marland Kitchen Discussed 150 minutes of exercise a week.  Encouraged vitamin D 1000 units and Calcium 1300mg  or 4 servings of dairy a day.  Fasting labs ordered.  [facility] and [facility] will fax for records.  No need for colonoscopy due to colon removed.  Declined flu shot.  Discussed shingrix and will decided.   Marland Kitchen.Discussed low carb diet with 1500 calories and 80g of protein.  Exercising at least 150 minutes a week.  My Fitness Pal could be a Personal assistant.  Encouraged her to cal insurance and consider saxenda. Discussed how to use and titrate up. Given coupon card. Follow up in 2 months after starting to use.  See After Visit Summary for Counseling Recommendations

## 2018-11-24 NOTE — Patient Instructions (Signed)
saxenda injectable  Qsymia(topamax/phentermine)   Health Maintenance, Female Adopting a healthy lifestyle and getting preventive care can go a long way to promote health and wellness. Talk with your health care provider about what schedule of regular examinations is right for you. This is a good chance for you to check in with your provider about disease prevention and staying healthy. In between checkups, there are plenty of things you can do on your own. Experts have done a lot of research about which lifestyle changes and preventive measures are most likely to keep you healthy. Ask your health care provider for more information. Weight and diet Eat a healthy diet  Be sure to include plenty of vegetables, fruits, low-fat dairy products, and lean protein.  Do not eat a lot of foods high in solid fats, added sugars, or salt.  Get regular exercise. This is one of the most important things you can do for your health. ? Most adults should exercise for at least 150 minutes each week. The exercise should increase your heart rate and make you sweat (moderate-intensity exercise). ? Most adults should also do strengthening exercises at least twice a week. This is in addition to the moderate-intensity exercise. Maintain a healthy weight  Body mass index (BMI) is a measurement that can be used to identify possible weight problems. It estimates body fat based on height and weight. Your health care provider can help determine your BMI and help you achieve or maintain a healthy weight.  For females 62 years of age and older: ? A BMI below 18.5 is considered underweight. ? A BMI of 18.5 to 24.9 is normal. ? A BMI of 25 to 29.9 is considered overweight. ? A BMI of 30 and above is considered obese. Watch levels of cholesterol and blood lipids  You should start having your blood tested for lipids and cholesterol at 59 years of age, then have this test every 5 years.  You may need to have your  cholesterol levels checked more often if: ? Your lipid or cholesterol levels are high. ? You are older than 59 years of age. ? You are at high risk for heart disease. Cancer screening Lung Cancer  Lung cancer screening is recommended for adults 59-73 years old who are at high risk for lung cancer because of a history of smoking.  A yearly low-dose CT scan of the lungs is recommended for people who: ? Currently smoke. ? Have quit within the past 15 years. ? Have at least a 30-pack-year history of smoking. A pack year is smoking an average of one pack of cigarettes a day for 1 year.  Yearly screening should continue until it has been 15 years since you quit.  Yearly screening should stop if you develop a health problem that would prevent you from having lung cancer treatment. Breast Cancer  Practice breast self-awareness. This means understanding how your breasts normally appear and feel.  It also means doing regular breast self-exams. Let your health care provider know about any changes, no matter how small.  If you are in your 20s or 30s, you should have a clinical breast exam (CBE) by a health care provider every 1-3 years as part of a regular health exam.  If you are 58 or older, have a CBE every year. Also consider having a breast X-ray (mammogram) every year.  If you have a family history of breast cancer, talk to your health care provider about genetic screening.  If you are at  high risk for breast cancer, talk to your health care provider about having an MRI and a mammogram every year.  Breast cancer gene (BRCA) assessment is recommended for women who have family members with BRCA-related cancers. BRCA-related cancers include: ? Breast. ? Ovarian. ? Tubal. ? Peritoneal cancers.  Results of the assessment will determine the need for genetic counseling and BRCA1 and BRCA2 testing. Cervical Cancer Your health care provider may recommend that you be screened regularly for  cancer of the pelvic organs (ovaries, uterus, and vagina). This screening involves a pelvic examination, including checking for microscopic changes to the surface of your cervix (Pap test). You may be encouraged to have this screening done every 3 years, beginning at age 59.  For women ages 53-65, health care providers may recommend pelvic exams and Pap testing every 3 years, or they may recommend the Pap and pelvic exam, combined with testing for human papilloma virus (HPV), every 5 years. Some types of HPV increase your risk of cervical cancer. Testing for HPV may also be done on women of any age with unclear Pap test results.  Other health care providers may not recommend any screening for nonpregnant women who are considered low risk for pelvic cancer and who do not have symptoms. Ask your health care provider if a screening pelvic exam is right for you.  If you have had past treatment for cervical cancer or a condition that could lead to cancer, you need Pap tests and screening for cancer for at least 20 years after your treatment. If Pap tests have been discontinued, your risk factors (such as having a new sexual partner) need to be reassessed to determine if screening should resume. Some women have medical problems that increase the chance of getting cervical cancer. In these cases, your health care provider may recommend more frequent screening and Pap tests. Colorectal Cancer  This type of cancer can be detected and often prevented.  Routine colorectal cancer screening usually begins at 59 years of age and continues through 59 years of age.  Your health care provider may recommend screening at an earlier age if you have risk factors for colon cancer.  Your health care provider may also recommend using home test kits to check for hidden blood in the stool.  A small camera at the end of a tube can be used to examine your colon directly (sigmoidoscopy or colonoscopy). This is done to check for  the earliest forms of colorectal cancer.  Routine screening usually begins at age 49.  Direct examination of the colon should be repeated every 5-10 years through 59 years of age. However, you may need to be screened more often if early forms of precancerous polyps or small growths are found. Skin Cancer  Check your skin from head to toe regularly.  Tell your health care provider about any new moles or changes in moles, especially if there is a change in a mole's shape or color.  Also tell your health care provider if you have a mole that is larger than the size of a pencil eraser.  Always use sunscreen. Apply sunscreen liberally and repeatedly throughout the day.  Protect yourself by wearing long sleeves, pants, a wide-brimmed hat, and sunglasses whenever you are outside. Heart disease, diabetes, and high blood pressure  High blood pressure causes heart disease and increases the risk of stroke. High blood pressure is more likely to develop in: ? People who have blood pressure in the high end of the normal  range (130-139/85-89 mm Hg). ? People who are overweight or obese. ? People who are African American.  If you are 13-65 years of age, have your blood pressure checked every 3-5 years. If you are 68 years of age or older, have your blood pressure checked every year. You should have your blood pressure measured twice-once when you are at a hospital or clinic, and once when you are not at a hospital or clinic. Record the average of the two measurements. To check your blood pressure when you are not at a hospital or clinic, you can use: ? An automated blood pressure machine at a pharmacy. ? A home blood pressure monitor.  If you are between 83 years and 4 years old, ask your health care provider if you should take aspirin to prevent strokes.  Have regular diabetes screenings. This involves taking a blood sample to check your fasting blood sugar level. ? If you are at a normal weight and  have a low risk for diabetes, have this test once every three years after 59 years of age. ? If you are overweight and have a high risk for diabetes, consider being tested at a younger age or more often. Preventing infection Hepatitis B  If you have a higher risk for hepatitis B, you should be screened for this virus. You are considered at high risk for hepatitis B if: ? You were born in a country where hepatitis B is common. Ask your health care provider which countries are considered high risk. ? Your parents were born in a high-risk country, and you have not been immunized against hepatitis B (hepatitis B vaccine). ? You have HIV or AIDS. ? You use needles to inject street drugs. ? You live with someone who has hepatitis B. ? You have had sex with someone who has hepatitis B. ? You get hemodialysis treatment. ? You take certain medicines for conditions, including cancer, organ transplantation, and autoimmune conditions. Hepatitis C  Blood testing is recommended for: ? Everyone born from [birth year] through 1966. ? Anyone with known risk factors for hepatitis C. Sexually transmitted infections (STIs)  You should be screened for sexually transmitted infections (STIs) including gonorrhea and chlamydia if: ? You are sexually active and are younger than 59 years of age. ? You are older than 59 years of age and your health care provider tells you that you are at risk for this type of infection. ? Your sexual activity has changed since you were last screened and you are at an increased risk for chlamydia or gonorrhea. Ask your health care provider if you are at risk.  If you do not have HIV, but are at risk, it may be recommended that you take a prescription medicine daily to prevent HIV infection. This is called pre-exposure prophylaxis (PrEP). You are considered at risk if: ? You are sexually active and do not regularly use condoms or know the HIV status of your partner(s). ? You take drugs by  injection. ? You are sexually active with a partner who has HIV. Talk with your health care provider about whether you are at high risk of being infected with HIV. If you choose to begin PrEP, you should first be tested for HIV. You should then be tested every 3 months for as long as you are taking PrEP. Pregnancy  If you are premenopausal and you may become pregnant, ask your health care provider about preconception counseling.  If you may become pregnant, take 400 to 800  micrograms (mcg) of folic acid every day.  If you want to prevent pregnancy, talk to your health care provider about birth control (contraception). Osteoporosis and menopause  Osteoporosis is a disease in which the bones lose minerals and strength with aging. This can result in serious bone fractures. Your risk for osteoporosis can be identified using a bone density scan.  If you are 39 years of age or older, or if you are at risk for osteoporosis and fractures, ask your health care provider if you should be screened.  Ask your health care provider whether you should take a calcium or vitamin D supplement to lower your risk for osteoporosis.  Menopause may have certain physical symptoms and risks.  Hormone replacement therapy may reduce some of these symptoms and risks. Talk to your health care provider about whether hormone replacement therapy is right for you. Follow these instructions at home:  Schedule regular health, dental, and eye exams.  Stay current with your immunizations.  Do not use any tobacco products including cigarettes, chewing tobacco, or electronic cigarettes.  If you are pregnant, do not drink alcohol.  If you are breastfeeding, limit how much and how often you drink alcohol.  Limit alcohol intake to no more than 1 drink per day for nonpregnant women. One drink equals 12 ounces of beer, 5 ounces of wine, or 1 ounces of hard liquor.  Do not use street drugs.  Do not share needles.  Ask  your health care provider for help if you need support or information about quitting drugs.  Tell your health care provider if you often feel depressed.  Tell your health care provider if you have ever been abused or do not feel safe at home. This information is not intended to replace advice given to you by your health care provider. Make sure you discuss any questions you have with your health care provider. Document Released: 05/04/2011 Document Revised: 03/26/2016 Document Reviewed: 07/23/2015 Elsevier Interactive Patient Education  2019 [employer].

## 2018-11-25 ENCOUNTER — Telehealth: Payer: Self-pay

## 2018-11-25 ENCOUNTER — Telehealth: Payer: Self-pay | Admitting: Physician Assistant

## 2018-11-25 LAB — COMPLETE METABOLIC PANEL WITH GFR
AG RATIO: 1.8 (calc) (ref 1.0–2.5)
ALT: 19 U/L (ref 6–29)
AST: 20 U/L (ref 10–35)
Albumin: 4.4 g/dL (ref 3.6–5.1)
Alkaline phosphatase (APISO): 83 U/L (ref 33–130)
BILIRUBIN TOTAL: 0.6 mg/dL (ref 0.2–1.2)
BUN: 12 mg/dL (ref 7–25)
CALCIUM: 9.4 mg/dL (ref 8.6–10.4)
CHLORIDE: 108 mmol/L (ref 98–110)
CO2: 27 mmol/L (ref 20–32)
Creat: 0.78 mg/dL (ref 0.50–1.05)
GFR, EST NON AFRICAN AMERICAN: 84 mL/min/{1.73_m2} (ref >=60)
GFR, Est African American: 97 mL/min/{1.73_m2} (ref >=60)
GLUCOSE: 87 mg/dL (ref 65–99)
Globulin: 2.5 g/dL (calc) (ref 1.9–3.7)
Potassium: 4.4 mmol/L (ref 3.5–5.3)
Sodium: 142 mmol/L (ref 135–146)
Total Protein: 6.9 g/dL (ref 6.1–8.1)

## 2018-11-25 LAB — LIPID PANEL W/REFLEX DIRECT LDL
CHOLESTEROL: 249 mg/dL — AB (ref <200)
HDL: 79 mg/dL (ref >50)
LDL CHOLESTEROL (CALC): 147 mg/dL — AB
Non-HDL Cholesterol (Calc): 170 mg/dL (calc) — ABNORMAL HIGH (ref <130)
Total CHOL/HDL Ratio: 3.2 (calc) (ref <5.0)
Triglycerides: 114 mg/dL (ref <150)

## 2018-11-25 LAB — HEPATITIS C ANTIBODY
Hepatitis C Ab: NONREACTIVE
SIGNAL TO CUT-OFF: 0.06 (ref <1.00)

## 2018-11-25 LAB — VITAMIN D 25 HYDROXY (VIT D DEFICIENCY, FRACTURES): Vit D, 25-Hydroxy: 24 ng/mL — ABNORMAL LOW (ref 30–100)

## 2018-11-25 NOTE — Telephone Encounter (Signed)
[facility]  Please fax for records.

## 2018-11-25 NOTE — Progress Notes (Signed)
Call pt: kidney, liver, glucose look great.  Cholesterol did improve. HDL stayed high which is great because that is your good cholesterol and LDL lowered to 147. Keep up the good work!!  I don't remember asking you about the shingles vaccine in the office. Older than 59 yo should consider getting it. It is a 2 dose series. Can get in office or at pharmacy. Any questions?

## 2018-11-25 NOTE — Telephone Encounter (Signed)
Done. Fax request sent over.

## 2018-11-25 NOTE — Telephone Encounter (Signed)
Pt called to let Lesly Rubenstein know she checked with her insurance and Bernie Covey was not covered. Pt states she would like for an RX to be sent and an authorization to be done if possible to see if it can be covered.

## 2018-11-25 NOTE — Progress Notes (Signed)
Vitamin D is low too. Not terrible but need to be on at least 1000 units a day.

## 2018-11-28 MED ORDER — LIRAGLUTIDE -WEIGHT MANAGEMENT 18 MG/3ML ~~LOC~~ SOPN: 0.6000 mg | PEN_INJECTOR | Freq: Every day | SUBCUTANEOUS | 1 refills | Status: DC

## 2018-11-28 NOTE — Telephone Encounter (Signed)
Sent rx.

## 2018-11-28 NOTE — Telephone Encounter (Signed)
Pt advised.

## 2018-12-01 ENCOUNTER — Ambulatory Visit (INDEPENDENT_AMBULATORY_CARE_PROVIDER_SITE_OTHER): Payer: BLUE CROSS/BLUE SHIELD

## 2018-12-01 ENCOUNTER — Ambulatory Visit (INDEPENDENT_AMBULATORY_CARE_PROVIDER_SITE_OTHER): Payer: BLUE CROSS/BLUE SHIELD | Admitting: Osteopathic Medicine

## 2018-12-01 VITALS — BP 137/86 | HR 83 | Ht 60.0 in | Wt 167.0 lb

## 2018-12-01 DIAGNOSIS — Z1231 Encounter for screening mammogram for malignant neoplasm of breast: Secondary | ICD-10-CM

## 2018-12-01 DIAGNOSIS — Z23 Encounter for immunization: Secondary | ICD-10-CM

## 2018-12-01 NOTE — Progress Notes (Signed)
Patient is here today for her first Shingrix shot. Shot given in the left deltoid without complications. Patient was advised to make an appointment in 2 months to get her second shingles shot. Patient verbalized understanding. No further questions or concerns.

## 2018-12-02 NOTE — Progress Notes (Signed)
Call pt: normal mammogram. Follow up in 1 year.

## 2019-01-30 ENCOUNTER — Ambulatory Visit: Payer: BLUE CROSS/BLUE SHIELD

## 2019-02-11 IMAGING — DX DG CHEST 2V
2 series · 2 of 2 positions shown · non-contrast
Comparison: No recent prior .

CLINICAL DATA: Sporadic cough.

EXAM:
CHEST  2 VIEW

[chest pa]
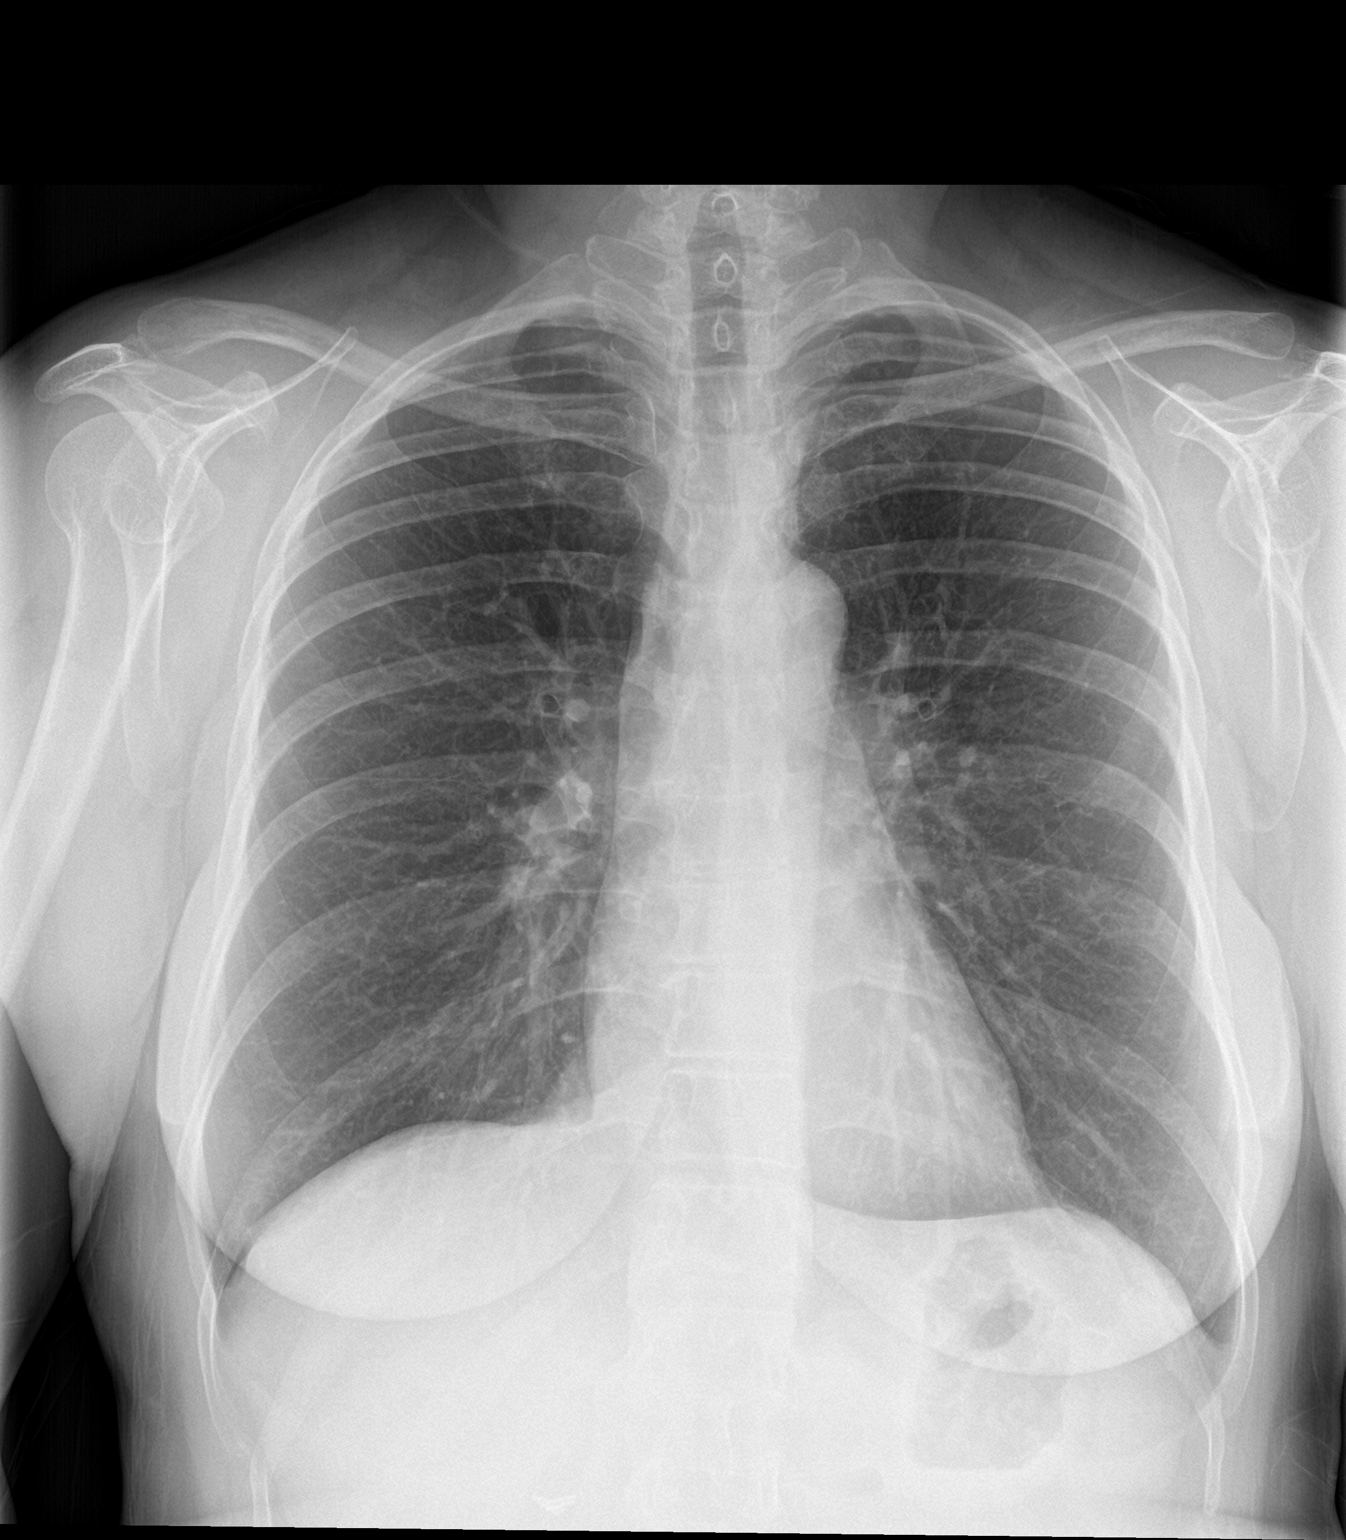

[chest lat]
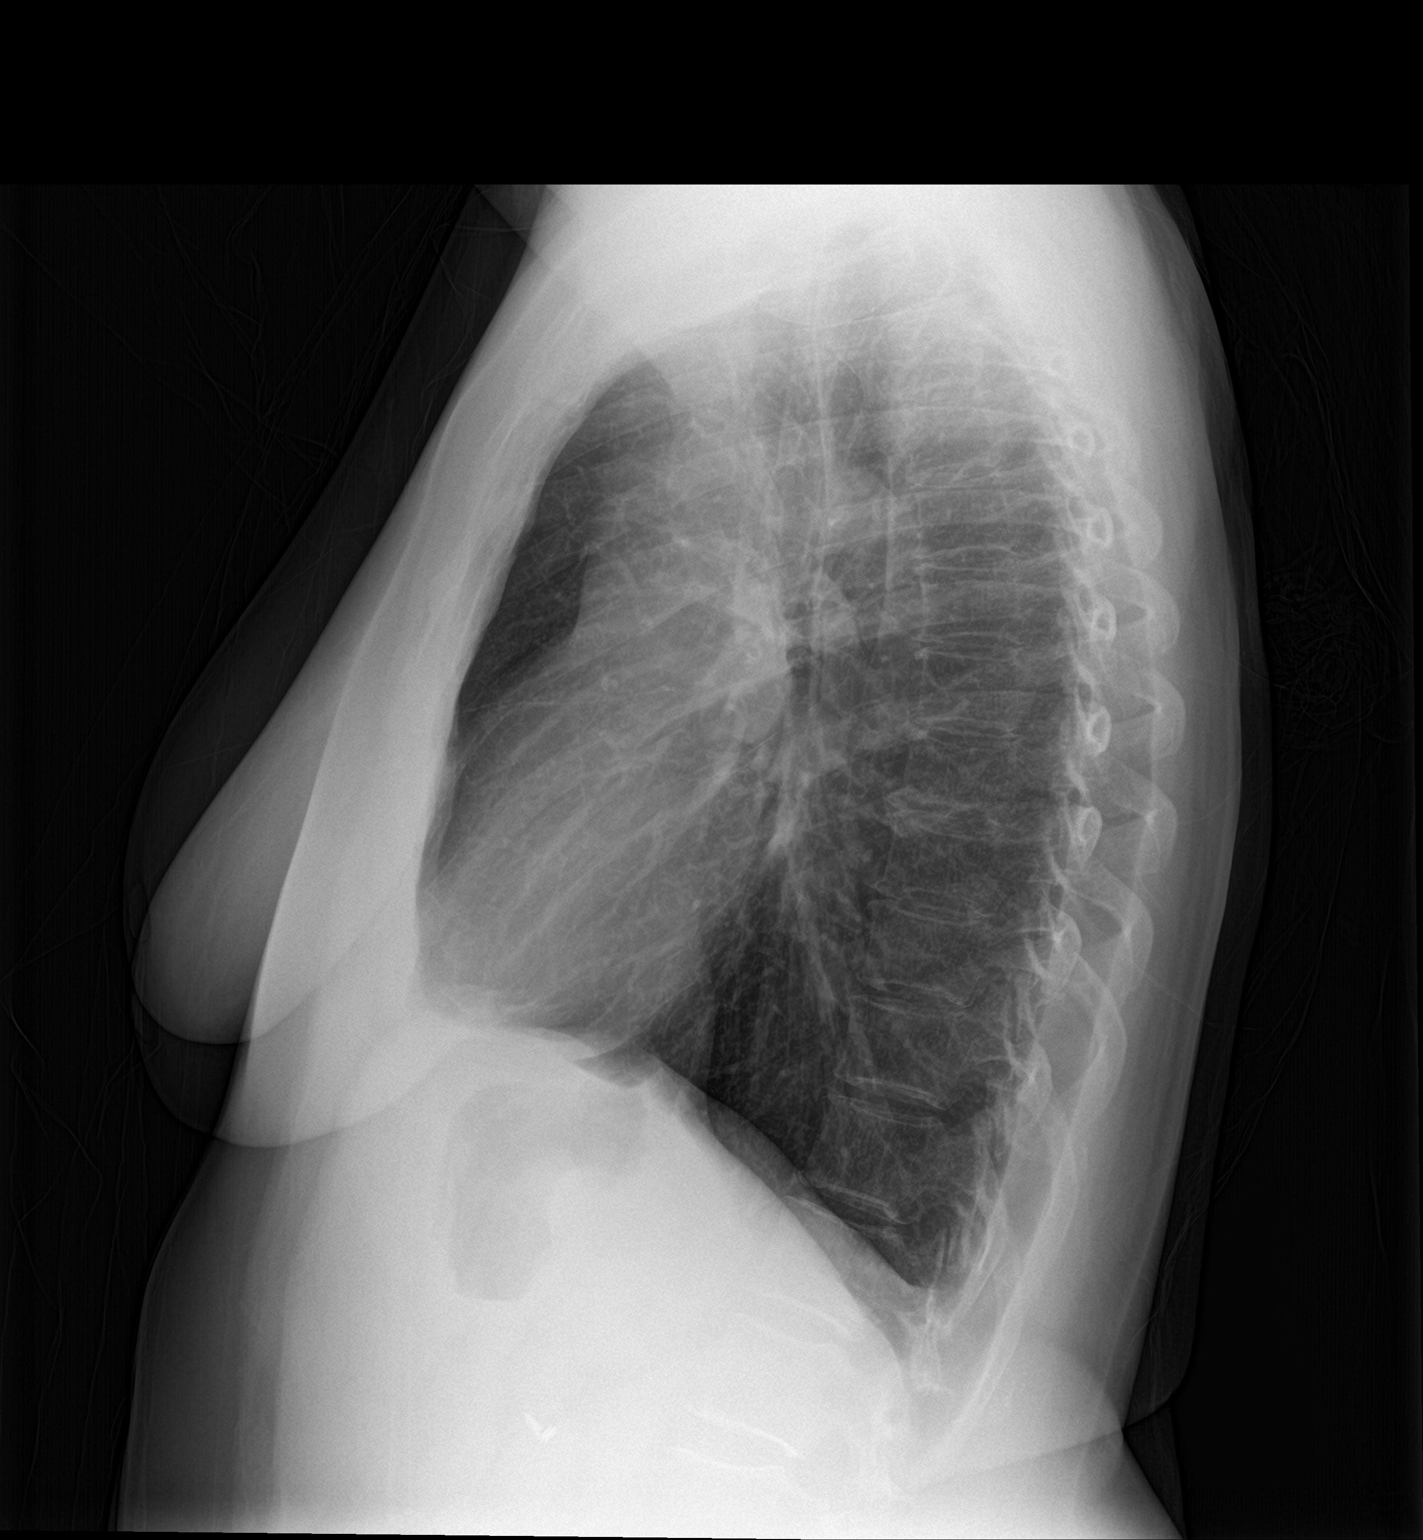

[2 of 2 positions shown; findings below may reference images not displayed]

FINDINGS: Mediastinum hilar structures normal. Lungs are clear. No pleural
effusion or pneumothorax. Heart size normal. Biapical
pleural-parenchymal thickening consistent with scarring.
IMPRESSION: No acute cardiopulmonary disease.

## 2019-02-23 ENCOUNTER — Ambulatory Visit (INDEPENDENT_AMBULATORY_CARE_PROVIDER_SITE_OTHER): Payer: BLUE CROSS/BLUE SHIELD | Admitting: Physician Assistant

## 2019-02-23 VITALS — Temp 98.1°F

## 2019-02-23 DIAGNOSIS — Z23 Encounter for immunization: Secondary | ICD-10-CM | POA: Diagnosis not present

## 2019-02-23 NOTE — Progress Notes (Signed)
Established Patient Office Visit  Subjective:  Patient ID: Renee Burns, female    DOB: 19-Mar-1961  Age: 58 y.o. MRN: [id number]  CC:  Chief Complaint  Patient presents with  . Immunizations    HPI Renee Burns presents for last Shingrix vaccine. Denies any problems with vaccines in the past. She is here for a drive up service for the vaccine. Meet Tobie at her car, verified with name and date of birth.   Past Medical History:  Diagnosis Date  . Anxiety   . Asthma   . Hyperlipidemia   . Ovarian cyst   . Pneumonia   . Ulcerative colitis [facility])     Past Surgical History:  Procedure Laterality Date  . CESAREAN SECTION    . CHOLECYSTECTOMY    . COLOSTOMY      Family History  Problem Relation Age of Onset  . Heart disease Father   . Cancer Father        bladder CA  . Diabetes Mother     Social History   Socioeconomic History  . Marital status: Married    Spouse name: Not on file  . Number of children: Not on file  . Years of education: Not on file  . Highest education level: Not on file  Occupational History  . Occupation: Paediatric nurse  Social Needs  . Financial resource strain: Not on file  . Food insecurity:    Worry: Not on file    Inability: Not on file  . Transportation needs:    Medical: Not on file    Non-medical: Not on file  Tobacco Use  . Smoking status: Never Smoker  . Smokeless tobacco: Never Used  Substance and Sexual Activity  . Alcohol use: No  . Drug use: No  . Sexual activity: Yes  Lifestyle  . Physical activity:    Days per week: Not on file    Minutes per session: Not on file  . Stress: Not on file  Relationships  . Social connections:    Talks on phone: Not on file    Gets together: Not on file    Attends religious service: Not on file    Active member of club or organization: Not on file    Attends meetings of clubs or organizations: Not on file    Relationship status: Not on file  . Intimate partner violence:    Fear of  current or ex partner: Not on file    Emotionally abused: Not on file    Physically abused: Not on file    Forced sexual activity: Not on file  Other Topics Concern  . Not on file  Social History Narrative  . Not on file    Outpatient Medications Prior to Visit  Medication Sig Dispense Refill  . albuterol (PROVENTIL HFA;VENTOLIN HFA) 108 (90 Base) MCG/ACT inhaler Inhale 2 puffs into the lungs every 6 (six) hours as needed for wheezing or shortness of breath. 1 Inhaler 2  . buPROPion (WELLBUTRIN SR) 150 MG 12 hr tablet Take 1 tablet (150 mg total) by mouth 2 (two) times daily. 180 tablet 4  . escitalopram (LEXAPRO) 10 MG tablet Take 1 tablet (10 mg total) by mouth daily. 90 tablet 4  . fluticasone (FLOVENT HFA) 44 MCG/ACT inhaler Inhale 2 puffs into the lungs 2 (two) times daily. 1 Inhaler 11  . Liraglutide -Weight Management (SAXENDA) 18 MG/3ML SOPN Inject 0.6 mg into the skin daily. For one week then increase by .6mg  weekly until reaches  3mg  daily.  Please include ultra fine needles 6mm 4 pen 1  . Multiple Minerals-Vitamins (CALCIUM CITRATE +) TABS Take by mouth daily.    . Multiple Vitamins-Minerals (WOMENS MULTIVITAMIN PLUS) TABS Take 1 tablet by mouth.     No facility-administered medications prior to visit.     Allergies  Allergen Reactions  . Augmentin [Amoxicillin-Pot Clavulanate]   . Loracarbef Rash    ROS Review of Systems    Objective:    Physical Exam  Temp 98.1 F (36.7 C) (Oral)   LMP 08/12/2012  Wt Readings from Last 3 Encounters:  12/01/18 167 lb (75.8 kg)  11/24/18 167 lb (75.8 kg)  11/05/17 161 lb (73 kg)     Health Maintenance Due  Topic Date Due  . PAP SMEAR-Modifier  06/12/2017    There are no preventive care reminders to display for this patient.  Lab Results  Component Value Date   TSH 1.52 11/05/2017   Lab Results  Component Value Date   WBC 6.7 11/05/2017   HGB 12.8 11/05/2017   HCT 37.5 11/05/2017   MCV 84.3 11/05/2017   PLT  298 11/05/2017   Lab Results  Component Value Date   NA 142 11/24/2018   K 4.4 11/24/2018   CO2 27 11/24/2018   GLUCOSE 87 11/24/2018   BUN 12 11/24/2018   CREATININE 0.78 11/24/2018   BILITOT 0.6 11/24/2018   ALKPHOS 85 12/14/2015   AST 20 11/24/2018   ALT 19 11/24/2018   PROT 6.9 11/24/2018   CALCIUM 9.4 11/24/2018   Lab Results  Component Value Date   CHOL 249 (H) 11/24/2018   Lab Results  Component Value Date   HDL 79 11/24/2018   Lab Results  Component Value Date   LDLCALC 147 (H) 11/24/2018   Lab Results  Component Value Date   TRIG 114 11/24/2018   Lab Results  Component Value Date   CHOLHDL 3.2 11/24/2018   No results found for: HGBA1C    Assessment & Plan:  Need for Shingles vaccine - Patient tolerated injection well without complications.    Problem List Items Addressed This Visit    None    Visit Diagnoses    Need for shingles vaccine    -  Primary   Relevant Orders   Varicella-zoster vaccine IM (Shingrix) (Completed)      No orders of the defined types were placed in this encounter.   Follow-up: Last injection for the shingrix vaccine. No follow up needed.    Esmond Harpsuttle, Jacquel Mccamish Hale, CMA

## 2019-03-02 ENCOUNTER — Ambulatory Visit: Payer: BLUE CROSS/BLUE SHIELD

## 2020-02-14 ENCOUNTER — Other Ambulatory Visit: Payer: Self-pay | Admitting: Physician Assistant

## 2020-02-14 DIAGNOSIS — F419 Anxiety disorder, unspecified: Secondary | ICD-10-CM

## 2020-02-14 DIAGNOSIS — F339 Major depressive disorder, recurrent, unspecified: Secondary | ICD-10-CM

## 2020-02-15 ENCOUNTER — Other Ambulatory Visit: Payer: Self-pay | Admitting: Physician Assistant

## 2020-02-15 DIAGNOSIS — F339 Major depressive disorder, recurrent, unspecified: Secondary | ICD-10-CM

## 2020-02-15 DIAGNOSIS — F419 Anxiety disorder, unspecified: Secondary | ICD-10-CM

## 2020-02-21 ENCOUNTER — Ambulatory Visit (INDEPENDENT_AMBULATORY_CARE_PROVIDER_SITE_OTHER): Payer: BC Managed Care – PPO | Admitting: Physician Assistant

## 2020-02-21 ENCOUNTER — Ambulatory Visit (INDEPENDENT_AMBULATORY_CARE_PROVIDER_SITE_OTHER): Payer: BC Managed Care – PPO

## 2020-02-21 ENCOUNTER — Other Ambulatory Visit: Payer: Self-pay

## 2020-02-21 VITALS — BP 141/78 | HR 99 | Ht 60.0 in | Wt 152.0 lb

## 2020-02-21 DIAGNOSIS — Z13220 Encounter for screening for lipoid disorders: Secondary | ICD-10-CM

## 2020-02-21 DIAGNOSIS — Z131 Encounter for screening for diabetes mellitus: Secondary | ICD-10-CM | POA: Diagnosis not present

## 2020-02-21 DIAGNOSIS — J45991 Cough variant asthma: Secondary | ICD-10-CM

## 2020-02-21 DIAGNOSIS — Z0000 Encounter for general adult medical examination without abnormal findings: Secondary | ICD-10-CM

## 2020-02-21 DIAGNOSIS — F419 Anxiety disorder, unspecified: Secondary | ICD-10-CM

## 2020-02-21 DIAGNOSIS — F339 Major depressive disorder, recurrent, unspecified: Secondary | ICD-10-CM

## 2020-02-21 DIAGNOSIS — Z1231 Encounter for screening mammogram for malignant neoplasm of breast: Secondary | ICD-10-CM | POA: Diagnosis not present

## 2020-02-21 DIAGNOSIS — J301 Allergic rhinitis due to pollen: Secondary | ICD-10-CM

## 2020-02-21 LAB — COMPLETE METABOLIC PANEL WITH GFR
AG Ratio: 1.6 (calc) (ref 1.0–2.5)
ALT: 19 U/L (ref 6–29)
AST: 19 U/L (ref 10–35)
Albumin: 4.4 g/dL (ref 3.6–5.1)
Alkaline phosphatase (APISO): 87 U/L (ref 37–153)
BUN: 12 mg/dL (ref 7–25)
CO2: 26 mmol/L (ref 20–32)
Calcium: 10.1 mg/dL (ref 8.6–10.4)
Chloride: 108 mmol/L (ref 98–110)
Creat: 0.8 mg/dL (ref 0.50–1.05)
GFR, Est African American: 94 mL/min/{1.73_m2} (ref >=60)
GFR, Est Non African American: 81 mL/min/{1.73_m2} (ref >=60)
Globulin: 2.7 g/dL (calc) (ref 1.9–3.7)
Glucose, Bld: 103 mg/dL — ABNORMAL HIGH (ref 65–99)
Potassium: 4.7 mmol/L (ref 3.5–5.3)
Sodium: 141 mmol/L (ref 135–146)
Total Bilirubin: 0.4 mg/dL (ref 0.2–1.2)
Total Protein: 7.1 g/dL (ref 6.1–8.1)

## 2020-02-21 LAB — LIPID PANEL W/REFLEX DIRECT LDL
Cholesterol: 220 mg/dL — ABNORMAL HIGH (ref <200)
HDL: 64 mg/dL (ref >=50)
LDL Cholesterol (Calc): 135 mg/dL (calc) — ABNORMAL HIGH
Non-HDL Cholesterol (Calc): 156 mg/dL (calc) — ABNORMAL HIGH (ref <130)
Total CHOL/HDL Ratio: 3.4 (calc) (ref <5.0)
Triglycerides: 99 mg/dL (ref <150)

## 2020-02-21 MED ORDER — ALBUTEROL SULFATE HFA 108 (90 BASE) MCG/ACT IN AERS: 2.0000 | INHALATION_SPRAY | Freq: Four times a day (QID) | RESPIRATORY_TRACT | 3 refills | Status: DC | PRN

## 2020-02-21 MED ORDER — FLUTICASONE PROPIONATE 50 MCG/ACT NA SUSP: 2.0000 | Freq: Every day | NASAL | 6 refills | Status: AC

## 2020-02-21 MED ORDER — METHYLPREDNISOLONE 4 MG PO TBPK: ORAL_TABLET | ORAL | 0 refills | Status: DC

## 2020-02-21 MED ORDER — MONTELUKAST SODIUM 10 MG PO TABS: 10.0000 mg | ORAL_TABLET | Freq: Every day | ORAL | 3 refills | Status: DC

## 2020-02-21 MED ORDER — BUPROPION HCL ER (SR) 150 MG PO TB12: 150.0000 mg | ORAL_TABLET | Freq: Two times a day (BID) | ORAL | 3 refills | Status: DC

## 2020-02-21 MED ORDER — ESCITALOPRAM OXALATE 10 MG PO TABS: 10.0000 mg | ORAL_TABLET | Freq: Every day | ORAL | 3 refills | Status: DC

## 2020-02-21 MED ORDER — FLOVENT HFA 44 MCG/ACT IN AERO: 2.0000 | INHALATION_SPRAY | Freq: Two times a day (BID) | RESPIRATORY_TRACT | 11 refills | Status: DC

## 2020-02-21 NOTE — Progress Notes (Signed)
Normal mammogram. Follow up in 1 year.

## 2020-02-21 NOTE — Patient Instructions (Signed)
Health Maintenance, Female Adopting a healthy lifestyle and getting preventive care are important in promoting health and wellness. Ask your health care provider about:  The right schedule for you to have regular tests and exams.  Things you can do on your own to prevent diseases and keep yourself healthy. What should I know about diet, weight, and exercise? Eat a healthy diet   Eat a diet that includes plenty of vegetables, fruits, low-fat dairy products, and lean protein.  Do not eat a lot of foods that are high in solid fats, added sugars, or sodium. Maintain a healthy weight Body mass index (BMI) is used to identify weight problems. It estimates body fat based on height and weight. Your health care provider can help determine your BMI and help you achieve or maintain a healthy weight. Get regular exercise Get regular exercise. This is one of the most important things you can do for your health. Most adults should:  Exercise for at least 150 minutes each week. The exercise should increase your heart rate and make you sweat (moderate-intensity exercise).  Do strengthening exercises at least twice a week. This is in addition to the moderate-intensity exercise.  Spend less time sitting. Even light physical activity can be beneficial. Watch cholesterol and blood lipids Have your blood tested for lipids and cholesterol at 60 years of age, then have this test every 5 years. Have your cholesterol levels checked more often if:  Your lipid or cholesterol levels are high.  You are older than 60 years of age.  You are at high risk for heart disease. What should I know about cancer screening? Depending on your health history and family history, you may need to have cancer screening at various ages. This may include screening for:  Breast cancer.  Cervical cancer.  Colorectal cancer.  Skin cancer.  Lung cancer. What should I know about heart disease, diabetes, and high blood  pressure? Blood pressure and heart disease  High blood pressure causes heart disease and increases the risk of stroke. This is more likely to develop in people who have high blood pressure readings, are of African descent, or are overweight.  Have your blood pressure checked: ? Every 3-5 years if you are 18-39 years of age. ? Every year if you are 40 years old or older. Diabetes Have regular diabetes screenings. This checks your fasting blood sugar level. Have the screening done:  Once every three years after age 40 if you are at a normal weight and have a low risk for diabetes.  More often and at a younger age if you are overweight or have a high risk for diabetes. What should I know about preventing infection? Hepatitis B If you have a higher risk for hepatitis B, you should be screened for this virus. Talk with your health care provider to find out if you are at risk for hepatitis B infection. Hepatitis C Testing is recommended for:  Everyone born from 1945 through 1965.  Anyone with known risk factors for hepatitis C. Sexually transmitted infections (STIs)  Get screened for STIs, including gonorrhea and chlamydia, if: ? You are sexually active and are younger than 60 years of age. ? You are older than 60 years of age and your health care provider tells you that you are at risk for this type of infection. ? Your sexual activity has changed since you were last screened, and you are at increased risk for chlamydia or gonorrhea. Ask your health care provider if   you are at risk.  Ask your health care provider about whether you are at high risk for HIV. Your health care provider may recommend a prescription medicine to help prevent HIV infection. If you choose to take medicine to prevent HIV, you should first get tested for HIV. You should then be tested every 3 months for as long as you are taking the medicine. Pregnancy  If you are about to stop having your period (premenopausal) and  you may become pregnant, seek counseling before you get pregnant.  Take 400 to 800 micrograms (mcg) of folic acid every day if you become pregnant.  Ask for birth control (contraception) if you want to prevent pregnancy. Osteoporosis and menopause Osteoporosis is a disease in which the bones lose minerals and strength with aging. This can result in bone fractures. If you are 65 years old or older, or if you are at risk for osteoporosis and fractures, ask your health care provider if you should:  Be screened for bone loss.  Take a calcium or vitamin D supplement to lower your risk of fractures.  Be given hormone replacement therapy (HRT) to treat symptoms of menopause. Follow these instructions at home: Lifestyle  Do not use any products that contain nicotine or tobacco, such as cigarettes, e-cigarettes, and chewing tobacco. If you need help quitting, ask your health care provider.  Do not use street drugs.  Do not share needles.  Ask your health care provider for help if you need support or information about quitting drugs. Alcohol use  Do not drink alcohol if: ? Your health care provider tells you not to drink. ? You are pregnant, may be pregnant, or are planning to become pregnant.  If you drink alcohol: ? Limit how much you use to 0-1 drink a day. ? Limit intake if you are breastfeeding.  Be aware of how much alcohol is in your drink. In the [place] one drink equals one 12 oz bottle of beer (355 mL), one 5 oz glass of wine (148 mL), or one 1 oz glass of hard liquor (44 mL). General instructions  Schedule regular health, dental, and eye exams.  Stay current with your vaccines.  Tell your health care provider if: ? You often feel depressed. ? You have ever been abused or do not feel safe at home. Summary  Adopting a healthy lifestyle and getting preventive care are important in promoting health and wellness.  Follow your health care provider's instructions about healthy  diet, exercising, and getting tested or screened for diseases.  Follow your health care provider's instructions on monitoring your cholesterol and blood pressure. This information is not intended to replace advice given to you by your health care provider. Make sure you discuss any questions you have with your health care provider. Document Revised: 10/12/2018 Document Reviewed: 10/12/2018 Elsevier Patient Education  2020 Elsevier Inc.  

## 2020-02-22 ENCOUNTER — Encounter: Payer: Self-pay | Admitting: Physician Assistant

## 2020-02-22 DIAGNOSIS — R7301 Impaired fasting glucose: Secondary | ICD-10-CM | POA: Insufficient documentation

## 2020-02-22 NOTE — Progress Notes (Signed)
Renee Burns,   Cholesterol better than last year. Great job. CV risk is 3.3 percent in 10 years.   Kidney and liver look great.   Sugar was a tad elevated this year. Watch sugars and carbs. We might should recheck in 6 months just to make sure not trending up. A1C in 6 months to be ordered.

## 2020-02-22 NOTE — Progress Notes (Signed)
Subjective:     Renee Burns is a 59 y.o. female and is here for a comprehensive physical exam. The patient reports problems - with allergies and asthma. this time of year is always hard. on flovent and albuterol as needed. she has had a week of more SOB/cough/runny nose/congestion. no fever, chills, body aches or fatigue.    Mood is controlled for the most part. She does feel like she is having anger outburst and easily irritated. She is exercising. Her work is really stressful right now.   Social History   Socioeconomic History  . Marital status: Married    Spouse name: Not on file  . Number of children: Not on file  . Years of education: Not on file  . Highest education level: Not on file  Occupational History  . Occupation: Art gallery manager  Tobacco Use  . Smoking status: Never Smoker  . Smokeless tobacco: Never Used  Substance and Sexual Activity  . Alcohol use: No  . Drug use: No  . Sexual activity: Yes  Other Topics Concern  . Not on file  Social History Narrative  . Not on file   Social Determinants of Health   Financial Resource Strain:   . Difficulty of Paying Living Expenses:   Food Insecurity:   . Worried About Charity fundraiser in the Last Year:   . Arboriculturist in the Last Year:   Transportation Needs:   . Film/video editor (Medical):   Marland Kitchen Lack of Transportation (Non-Medical):   Physical Activity:   . Days of Exercise per Week:   . Minutes of Exercise per Session:   Stress:   . Feeling of Stress :   Social Connections:   . Frequency of Communication with Friends and Family:   . Frequency of Social Gatherings with Friends and Family:   . Attends Religious Services:   . Active Member of Clubs or Organizations:   . Attends Archivist Meetings:   Marland Kitchen Marital Status:   Intimate Partner Violence:   . Fear of Current or Ex-Partner:   . Emotionally Abused:   Marland Kitchen Physically Abused:   . Sexually Abused:    Health Maintenance  Topic Date Due  . HIV  Screening  Never done  . COVID-19 Vaccine (1) 03/08/2020 (Originally 04/25/1977)  . PAP SMEAR-Modifier  02/20/2021 (Originally 06/12/2017)  . INFLUENZA VACCINE  06/02/2020  . MAMMOGRAM  02/20/2022  . TETANUS/TDAP  11/02/2025  . Hepatitis C Screening  Completed    The following portions of the patient's history were reviewed and updated as appropriate: allergies, current medications, past family history, past medical history, past social history, past surgical history and problem list.  Review of Systems Pertinent items noted in HPI and remainder of comprehensive ROS otherwise negative.   Objective:    BP (!) 141/78   Pulse 99   Ht 5' (1.524 m)   Wt 152 lb (68.9 kg)   LMP 08/12/2012   SpO2 99%   BMI 29.69 kg/m  General appearance: alert, cooperative and appears stated age Head: Normocephalic, without obvious abnormality, atraumatic Eyes: conjunctivae/corneas clear. PERRL, EOM's intact. Fundi benign. Ears: normal TM's and external ear canals both ears Nose: Nares normal. Septum midline. Mucosa normal. No drainage or sinus tenderness. Throat: lips, mucosa, and tongue normal; teeth and gums normal Neck: no adenopathy, no carotid bruit, no JVD, supple, symmetrical, trachea midline and thyroid not enlarged, symmetric, no tenderness/mass/nodules Back: symmetric, no curvature. ROM normal. No CVA tenderness. Lungs: clear  to auscultation bilaterally Heart: regular rate and rhythm, S1, S2 normal, no murmur, click, rub or gallop Abdomen: soft, non-tender; bowel sounds normal; no masses,  no organomegaly Extremities: extremities normal, atraumatic, no cyanosis or edema Pulses: 2+ and symmetric Skin: Skin color, texture, turgor normal. No rashes or lesions Lymph nodes: Cervical, supraclavicular, and axillary nodes normal. Neurologic: Alert and oriented X 3, normal strength and tone. Normal symmetric reflexes. Normal coordination and gait    Assessment:    Healthy female exam.      Plan:     Marland KitchenMarland KitchenRitu was seen today for annual exam.  Diagnoses and all orders for this visit:  Routine physical examination -     MM 3D SCREEN BREAST BILATERAL -     Lipid Panel w/reflex Direct LDL -     COMPLETE METABOLIC PANEL WITH GFR  Depression, recurrent (HCC) -     buPROPion (WELLBUTRIN SR) 150 MG 12 hr tablet; Take 1 tablet (150 mg total) by mouth 2 (two) times daily. -     escitalopram (LEXAPRO) 10 MG tablet; Take 1 tablet (10 mg total) by mouth daily.  Anxiety -     buPROPion (WELLBUTRIN SR) 150 MG 12 hr tablet; Take 1 tablet (150 mg total) by mouth 2 (two) times daily. -     escitalopram (LEXAPRO) 10 MG tablet; Take 1 tablet (10 mg total) by mouth daily.  Asthma, cough variant -     fluticasone (FLOVENT HFA) 44 MCG/ACT inhaler; Inhale 2 puffs into the lungs 2 (two) times daily. -     albuterol (VENTOLIN HFA) 108 (90 Base) MCG/ACT inhaler; Inhale 2 puffs into the lungs every 6 (six) hours as needed for wheezing or shortness of breath. -     montelukast (SINGULAIR) 10 MG tablet; Take 1 tablet (10 mg total) by mouth at bedtime. -     methylPREDNISolone (MEDROL DOSEPAK) 4 MG TBPK tablet; Take as directed by package insert.  Visit for screening mammogram -     MM 3D SCREEN BREAST BILATERAL  Screening for diabetes mellitus -     COMPLETE METABOLIC PANEL WITH GFR  Screening for lipid disorders -     Lipid Panel w/reflex Direct LDL  Seasonal allergic rhinitis due to pollen -     fluticasone (FLONASE) 50 MCG/ACT nasal spray; Place 2 sprays into both nostrils daily.  .. Depression screen [facility] 2/9 02/21/2020 11/24/2018 11/05/2017 05/22/2016  Decreased Interest 0 1 2 0  Down, Depressed, Hopeless 1 1 2  0  PHQ - 2 Score 1 2 4  0  Altered sleeping 0 1 1 -  Tired, decreased energy 1 1 2  -  Change in appetite 0 2 2 -  Feeling bad or failure about yourself  1 2 1  -  Trouble concentrating 0 1 1 -  Moving slowly or fidgety/restless 0 0 0 -  Suicidal thoughts 0 0 0 -  PHQ-9 Score 3 9 11  -   Difficult doing work/chores Not difficult at all Somewhat difficult - -   . GAD 7 : Generalized Anxiety Score 02/21/2020 11/24/2018 11/05/2017 05/22/2016  Nervous, Anxious, on Edge 2 1 1 1   Control/stop worrying 1 0 1 0  Worry too much - different things 1 1 1  0  Trouble relaxing 0 0 0 0  Restless 0 0 0 0  Easily annoyed or irritable 2 1 1 1   Afraid - awful might happen 1 0 1 0  Total GAD 7 Score 7 3 5 2   Anxiety Difficulty Somewhat difficult  Somewhat difficult - -    .Marland Kitchen Discussed 150 minutes of exercise a week.  Encouraged vitamin D 1000 units and Calcium 1300mg  or 4 servings of dairy a day.  Fasting labs ordered.  Declined pap.  Mammogram ordered.  Colon has been removed.  shingrix UTD.  Encouraged to get covid vaccine.   Discussed allergies and asthma. Added singulair and flonase to flovent and albuterol. Medrol dose pack given today to help get out of exacerbation.   Depression/anxiety medications refilled. Discussed meditation and stress reduction. Continue to exercise regularly. Pt declined any medication adjustment. Follow up as needed.   See After Visit Summary for Counseling Recommendations

## 2020-05-16 ENCOUNTER — Emergency Department (INDEPENDENT_AMBULATORY_CARE_PROVIDER_SITE_OTHER): Payer: BC Managed Care – PPO

## 2020-05-16 ENCOUNTER — Emergency Department (INDEPENDENT_AMBULATORY_CARE_PROVIDER_SITE_OTHER)
Admission: EM | Admit: 2020-05-16 | Discharge: 2020-05-16 | Disposition: A | Discharge status: Home / Self Care | Payer: BC Managed Care – PPO | Source: Home / Self Care | Attending: Family Medicine | Admitting: Family Medicine

## 2020-05-16 ENCOUNTER — Other Ambulatory Visit: Payer: Self-pay

## 2020-05-16 ENCOUNTER — Telehealth: Payer: Self-pay

## 2020-05-16 DIAGNOSIS — R0789 Other chest pain: Secondary | ICD-10-CM | POA: Diagnosis not present

## 2020-05-16 DIAGNOSIS — R079 Chest pain, unspecified: Secondary | ICD-10-CM | POA: Diagnosis not present

## 2020-05-16 DIAGNOSIS — R059 Cough, unspecified: Secondary | ICD-10-CM

## 2020-05-16 DIAGNOSIS — J90 Pleural effusion, not elsewhere classified: Secondary | ICD-10-CM | POA: Diagnosis not present

## 2020-05-16 DIAGNOSIS — R05 Cough: Secondary | ICD-10-CM

## 2020-05-16 MED ORDER — PREDNISONE 20 MG PO TABS
ORAL_TABLET | ORAL | 0 refills | Status: DC
Start: 2020-05-16 — End: 2021-02-28

## 2020-05-16 NOTE — ED Triage Notes (Signed)
Pt c/o chest/LT rib pain since Tues am. Sharp pain and hard to breath. Hx of asthma. Pt says husband says she was coughing a lot Monday night in her sleep. Ibuprofen and pain patch tried with no relief.

## 2020-05-16 NOTE — Telephone Encounter (Signed)
Pt called requesting an appointment to be evaluated for shortness of breath and chest pain. Per pt, she has a history of asthma and was a few days coughing excessively. Pt stated that pain in chest is getting worse. Pt was informed no available openings today. Recommended to have a visit with UC to be evaluated of symptoms. Pt was agreeable with plan. No other inquiries during the call.

## 2020-05-16 NOTE — ED Provider Notes (Signed)
[facility]    CSN: [id number] Arrival date & time: 05/16/20  1009      History   Chief Complaint Chief Complaint  Patient presents with  . Chest Pain    HPI Renee Burns is a 59 y.o. female.   Three days ago patient awoke with sharp pain in her left anterior chest.  Her husband had remarked that she started coughing in her sleep the night before.  Her cough has now become productive and she has difficulty taking a deep inspiration because of the pain, but denies shortness of breath.  She has asthma, using her albuterol inhaler more often but it has not been helpful.  She has tried both ibuprofen and a pain patch on her chest without improvement. She has had pneumonia in the distant past.   The history is provided by the patient.    Past Medical History:  Diagnosis Date  . Anxiety   . Asthma   . Hyperlipidemia   . Ovarian cyst   . Pneumonia   . Ulcerative colitis [facility])     Patient Active Problem List   Diagnosis Date Noted  . Elevated fasting glucose 02/22/2020  . Class 1 obesity due to excess calories without serious comorbidity with body mass index (BMI) of 31.0 to 31.9 in adult 11/06/2017  . Anxiety 11/06/2017  . Macular degeneration, right eye 11/05/2017  . Overweight (BMI 25.0-29.9) 05/22/2016  . H/O colostomy 05/22/2016  . Abnormal weight gain 12/07/2014  . Hypercholesteremia 06/12/2014  . Asthma, cough variant 06/12/2014  . Affective disorder (HCC) 06/12/2014  . Depression, recurrent (HCC) 06/12/2014  . H/O chronic ulcerative colitis 01/25/2013    Past Surgical History:  Procedure Laterality Date  . CESAREAN SECTION    . CHOLECYSTECTOMY    . COLOSTOMY      OB History    Gravida  2   Para  2   Term  1   Preterm  1   AB      Living  2     SAB      TAB      Ectopic      Multiple      Live Births               Home Medications    Prior to Admission medications   Medication Sig Start Date End Date Taking?  Authorizing Provider  albuterol (VENTOLIN HFA) 108 (90 Base) MCG/ACT inhaler Inhale 2 puffs into the lungs every 6 (six) hours as needed for wheezing or shortness of breath. 02/21/20   Breeback, Jade L, PA-C  buPROPion (WELLBUTRIN SR) 150 MG 12 hr tablet Take 1 tablet (150 mg total) by mouth 2 (two) times daily. 02/21/20   Breeback, Jade L, PA-C  escitalopram (LEXAPRO) 10 MG tablet Take 1 tablet (10 mg total) by mouth daily. 02/21/20   Breeback, Jade L, PA-C  fluticasone (FLONASE) 50 MCG/ACT nasal spray Place 2 sprays into both nostrils daily. 02/21/20   Breeback, Jade L, PA-C  fluticasone (FLOVENT HFA) 44 MCG/ACT inhaler Inhale 2 puffs into the lungs 2 (two) times daily. 02/21/20   Breeback, Jade L, PA-C  methylPREDNISolone (MEDROL DOSEPAK) 4 MG TBPK tablet Take as directed by package insert. 02/21/20   Breeback, Jade L, PA-C  montelukast (SINGULAIR) 10 MG tablet Take 1 tablet (10 mg total) by mouth at bedtime. 02/21/20   Breeback, Lonna Cobb, PA-C  Multiple Minerals-Vitamins (CALCIUM CITRATE +) TABS Take by mouth daily.    [provider]  Multiple Vitamins-Minerals (WOMENS MULTIVITAMIN PLUS) TABS Take 1 tablet by mouth.    [provider]  predniSONE (DELTASONE) 20 MG tablet Take one tab by mouth twice daily for 4 days, then one daily for 3 days. Take with food. 05/16/20   Lattie Haw, MD    Family History Family History  Problem Relation Age of Onset  . Heart disease Father   . Cancer Father        bladder CA  . Diabetes Mother     Social History Social History   Tobacco Use  . Smoking status: Never Smoker  . Smokeless tobacco: Never Used  Substance Use Topics  . Alcohol use: No  . Drug use: No     Allergies   Augmentin [amoxicillin-pot clavulanate] and Loracarbef   Review of Systems Review of Systems No sore throat + cough + pleuritic pain No wheezing No nasal congestion ? post-nasal drainage No sinus pain/pressure No itchy/red eyes No earache No  hemoptysis No SOB No fever/chills No nausea No vomiting No abdominal pain No diarrhea No urinary symptoms No skin rash + fatigue No myalgias No headache Used OTC meds without relief   Physical Exam Triage Vital Signs ED Triage Vitals  Enc Vitals Group     BP 05/16/20 1018 (!) 152/80     Pulse Rate 05/16/20 1018 86     Resp 05/16/20 1018 17     Temp 05/16/20 1018 99.1 F (37.3 C)     Temp Source 05/16/20 1018 Oral     SpO2 05/16/20 1018 97 %     Weight --      Height --      Head Circumference --      Peak Flow --      Pain Score 05/16/20 1020 6     Pain Loc --      Pain Edu? --      Excl. in GC? --    No data found.  Updated Vital Signs BP (!) 152/80 (BP Location: Left Arm)   Pulse 86   Temp 99.1 F (37.3 C) (Oral)   Resp 17   LMP 08/12/2012   SpO2 97%   Visual Acuity Right Eye Distance:   Left Eye Distance:   Bilateral Distance:    Right Eye Near:   Left Eye Near:    Bilateral Near:     Physical Exam Vitals and nursing note reviewed.  Constitutional:      General: She is not in acute distress.    Appearance: She is not ill-appearing.  HENT:     Head: Normocephalic.     Right Ear: Tympanic membrane, ear canal and external ear normal.     Left Ear: Tympanic membrane, ear canal and external ear normal.     Nose: Nose normal.     Mouth/Throat:     Mouth: Mucous membranes are moist.     Pharynx: Oropharynx is clear.  Eyes:     Conjunctiva/sclera: Conjunctivae normal.     Pupils: Pupils are equal, round, and reactive to light.  Cardiovascular:     Rate and Rhythm: Normal rate and regular rhythm.     Heart sounds: Normal heart sounds.  Pulmonary:     Breath sounds: Normal breath sounds. No wheezing, rhonchi or rales.  Chest:     Chest wall: No tenderness.       Comments: Patient experiences pleuritic pain  as noted on diagram but there is no tenderness to palpation in  this area. Abdominal:     General: Bowel sounds are normal.      Tenderness: There is no abdominal tenderness.  Musculoskeletal:        General: No tenderness.     Cervical back: Neck supple.     Right lower leg: No edema.     Left lower leg: No edema.  Lymphadenopathy:     Cervical: No cervical adenopathy.  Skin:    General: Skin is warm and dry.     Findings: No rash.  Neurological:     Mental Status: She is alert and oriented to person, place, and time.      UC Treatments / Results  Labs (all labs ordered are listed, but only abnormal results are displayed) Labs Reviewed  POCT CBC W AUTO DIFF ([facility]):  WBC 7.8; LY 21.4; MO 5.3; GR 73.3; Hgb 12.1; Platelets 315     EKG  Rate:  78 BPM PR:  136 msec QT:  386 msec QTcH:  440 msec QRSD:  82 msec QRS axis:  54 degrees Interpretation:  within normal limits; normal sinus rhythm    Radiology DG Chest 2 View  Result Date: 05/16/2020 CLINICAL DATA:  Left upper chest pain and cough for 2 days. EXAM: CHEST - 2 VIEW COMPARISON:  01/13/2017 FINDINGS: Stable mild asymmetric elevation right hemidiaphragm. The lungs are clear without focal pneumonia, edema, pneumothorax or pleural effusion. The cardiopericardial silhouette is within normal limits for size. The visualized bony structures of the thorax show now acute abnormality. IMPRESSION: No active cardiopulmonary disease. Electronically Signed   By: Kennith Center M.D.   On: 05/16/2020 11:39    Procedures Procedures (including critical care time)  Medications Ordered in UC Medications - No data to display  Initial Impression / Assessment and Plan / UC Course  I have reviewed the triage vital signs and the nursing notes.  Pertinent labs & imaging results that were available during my care of the patient were reviewed by me and considered in my medical decision making (see chart for details).    Note low grade fever; suspect early viral URI.  There is no evidence of bacterial infection today.   Normal CBC and EKG reassuring.   Chest x-ray no acute changes.  ?costochondritis. Patient's left chest pain could also represent early herpes zoster; begin Valtrex if rash develops. Begin prednisone burst/taper.  Followup with Family Doctor if not improved in one week.  Final Clinical Impressions(s) / UC Diagnoses   Final diagnoses:  Chest wall pain  Cough     Discharge Instructions     If cold-like symptoms develop, try the following: Take plain guaifenesin (1200mg  extended release tabs such as Mucinex) twice daily, with plenty of water, for cough and congestion.  May add Pseudoephedrine (30mg , one or two every 4 to 6 hours) for sinus congestion.  Get adequate rest.   Use albuterol inhaler as needed. Try warm salt water gargles for sore throat.  Stop all antihistamines for now, and other non-prescription cough/cold preparations. May take Tylenol as needed for pain, fever, etc. May take Delsym Cough Suppressant ("12 Hour Cough Relief") at bedtime for nighttime cough.     ED Prescriptions    Medication Sig Dispense Auth. Provider   predniSONE (DELTASONE) 20 MG tablet Take one tab by mouth twice daily for 4 days, then one daily for 3 days. Take with food. 11 tablet , MD        , MD 05/19/20  0620  

## 2020-05-16 NOTE — Discharge Instructions (Addendum)
If cold-like symptoms develop, try the following: Take plain guaifenesin (1200mg  extended release tabs such as Mucinex) twice daily, with plenty of water, for cough and congestion.  May add Pseudoephedrine (30mg , one or two every 4 to 6 hours) for sinus congestion.  Get adequate rest.   Use albuterol inhaler as needed. Try warm salt water gargles for sore throat.  Stop all antihistamines for now, and other non-prescription cough/cold preparations. May take Tylenol as needed for pain, fever, etc. May take Delsym Cough Suppressant ("12 Hour Cough Relief") at bedtime for nighttime cough.

## 2021-02-28 ENCOUNTER — Other Ambulatory Visit: Payer: Self-pay

## 2021-02-28 ENCOUNTER — Other Ambulatory Visit: Payer: Self-pay | Admitting: Physician Assistant

## 2021-02-28 ENCOUNTER — Ambulatory Visit (INDEPENDENT_AMBULATORY_CARE_PROVIDER_SITE_OTHER): Payer: BC Managed Care – PPO | Admitting: Physician Assistant

## 2021-02-28 ENCOUNTER — Encounter: Payer: Self-pay | Admitting: Physician Assistant

## 2021-02-28 VITALS — BP 135/80 | HR 88 | Ht 60.0 in | Wt 170.0 lb

## 2021-02-28 DIAGNOSIS — F419 Anxiety disorder, unspecified: Secondary | ICD-10-CM

## 2021-02-28 DIAGNOSIS — Z1231 Encounter for screening mammogram for malignant neoplasm of breast: Secondary | ICD-10-CM

## 2021-02-28 DIAGNOSIS — E6609 Other obesity due to excess calories: Secondary | ICD-10-CM

## 2021-02-28 DIAGNOSIS — Z0000 Encounter for general adult medical examination without abnormal findings: Secondary | ICD-10-CM

## 2021-02-28 DIAGNOSIS — F339 Major depressive disorder, recurrent, unspecified: Secondary | ICD-10-CM

## 2021-02-28 DIAGNOSIS — Z6833 Body mass index (BMI) 33.0-33.9, adult: Secondary | ICD-10-CM

## 2021-02-28 DIAGNOSIS — J45991 Cough variant asthma: Secondary | ICD-10-CM

## 2021-02-28 DIAGNOSIS — Z13220 Encounter for screening for lipoid disorders: Secondary | ICD-10-CM | POA: Diagnosis not present

## 2021-02-28 DIAGNOSIS — Z131 Encounter for screening for diabetes mellitus: Secondary | ICD-10-CM

## 2021-02-28 MED ORDER — FLOVENT HFA 44 MCG/ACT IN AERO: 2.0000 | INHALATION_SPRAY | Freq: Two times a day (BID) | RESPIRATORY_TRACT | 11 refills | Status: DC

## 2021-02-28 MED ORDER — ESCITALOPRAM OXALATE 10 MG PO TABS: 10.0000 mg | ORAL_TABLET | Freq: Every day | ORAL | 3 refills | Status: DC

## 2021-02-28 MED ORDER — BUPROPION HCL ER (SR) 150 MG PO TB12: 150.0000 mg | ORAL_TABLET | Freq: Two times a day (BID) | ORAL | 3 refills | Status: DC

## 2021-02-28 MED ORDER — WEGOVY 0.25 MG/0.5ML ~~LOC~~ SOAJ: 0.2500 mg | SUBCUTANEOUS | 0 refills | Status: DC

## 2021-02-28 NOTE — Progress Notes (Signed)
Subjective:     Renee Burns is a 60 y.o. female and is here for a comprehensive physical exam. The patient reports problems - weight gain of 18lbs in last year. .  Social History   Socioeconomic History  . Marital status: Married    Spouse name: Not on file  . Number of children: Not on file  . Years of education: Not on file  . Highest education level: Not on file  Occupational History  . Occupation: Paediatric nurse  Tobacco Use  . Smoking status: Never Smoker  . Smokeless tobacco: Never Used  Substance and Sexual Activity  . Alcohol use: No  . Drug use: No  . Sexual activity: Yes  Other Topics Concern  . Not on file  Social History Narrative  . Not on file   Social Determinants of Health   Financial Resource Strain: Not on file  Food Insecurity: Not on file  Transportation Needs: Not on file  Physical Activity: Not on file  Stress: Not on file  Social Connections: Not on file  Intimate Partner Violence: Not on file   Health Maintenance  Topic Date Due  . PAP SMEAR-Modifier  06/12/2017  . HIV Screening  02/28/2022 (Originally 04/26/1976)  . INFLUENZA VACCINE  06/02/2021  . COVID-19 Vaccine (3 - Booster for Pfizer series) 06/05/2021  . MAMMOGRAM  02/20/2022  . TETANUS/TDAP  11/02/2025  . Hepatitis C Screening  Completed  . HPV VACCINES  Aged Out    The following portions of the patient's history were reviewed and updated as appropriate: allergies, current medications, past family history, past medical history, past social history, past surgical history and problem list.  Review of Systems A comprehensive review of systems was negative.   Objective:    BP 135/80   Pulse 88   Ht 5' (1.524 m)   Wt 170 lb (77.1 kg)   LMP 08/12/2012   SpO2 99%   BMI 33.20 kg/m  General appearance: alert, cooperative and appears stated age Head: Normocephalic, without obvious abnormality, atraumatic Eyes: conjunctivae/corneas clear. PERRL, EOM's intact. Fundi benign. Ears: normal  TM's and external ear canals both ears Nose: Nares normal. Septum midline. Mucosa normal. No drainage or sinus tenderness. Throat: lips, mucosa, and tongue normal; teeth and gums normal Neck: no adenopathy, no carotid bruit, no JVD, supple, symmetrical, trachea midline and thyroid not enlarged, symmetric, no tenderness/mass/nodules Back: symmetric, no curvature. ROM normal. No CVA tenderness. Lungs: clear to auscultation bilaterally Heart: regular rate and rhythm, S1, S2 normal, no murmur, click, rub or gallop Abdomen: soft, non-tender; bowel sounds normal; no masses,  no organomegaly Extremities: extremities normal, atraumatic, no cyanosis or edema Pulses: 2+ and symmetric Skin: Skin color, texture, turgor normal. No rashes or lesions Lymph nodes: Cervical, supraclavicular, and axillary nodes normal. Neurologic: Alert and oriented X 3, normal strength and tone. Normal symmetric reflexes. Normal coordination and gait    Assessment:    Healthy female exam.      Plan:    Marland KitchenMarland KitchenMckenzey was seen today for annual exam.  Diagnoses and all orders for this visit:  Routine physical examination -     CBC with Differential/Platelet -     COMPLETE METABOLIC PANEL WITH GFR -     Lipid Panel w/reflex Direct LDL -     TSH  Screening for diabetes mellitus -     COMPLETE METABOLIC PANEL WITH GFR  Screening for lipid disorders -     Lipid Panel w/reflex Direct LDL  Depression, recurrent (HCC) -  escitalopram (LEXAPRO) 10 MG tablet; Take 1 tablet (10 mg total) by mouth daily. -     buPROPion (WELLBUTRIN SR) 150 MG 12 hr tablet; Take 1 tablet (150 mg total) by mouth 2 (two) times daily.  Anxiety -     escitalopram (LEXAPRO) 10 MG tablet; Take 1 tablet (10 mg total) by mouth daily. -     buPROPion (WELLBUTRIN SR) 150 MG 12 hr tablet; Take 1 tablet (150 mg total) by mouth 2 (two) times daily.  Asthma, cough variant -     fluticasone (FLOVENT HFA) 44 MCG/ACT inhaler; Inhale 2 puffs into the  lungs 2 (two) times daily.  Class 1 obesity due to excess calories with serious comorbidity and body mass index (BMI) of 33.0 to 33.9 in adult -     Semaglutide-Weight Management (WEGOVY) 0.25 MG/0.5ML SOAJ; Inject 0.25 mg into the skin once a week.   .. Discussed 150 minutes of exercise a week.  Encouraged vitamin D 1000 units and Calcium 1300mg  or 4 servings of dairy a day.  Fasting labs ordered.  PHQ/GAD stable. lexapro and wellbutrin refilled.  Mammogram UTD.  Needs PAP.  Colonoscopy UTD.  Shingles and flu shot UTD.  Pt declines covid vaccine.   .Discussed low carb diet with 1500 calories and 80g of protein.  Exercising at least 150 minutes a week.  My Fitness Pal could be a Marland Kitchen.  Discussed wegovy and side effects.  Titrate up. Follow up in 3 months.  See After Visit Summary for Counseling Recommendations

## 2021-02-28 NOTE — Patient Instructions (Addendum)
Health Maintenance, Female Adopting a healthy lifestyle and getting preventive care are important in promoting health and wellness. Ask your health care provider about:  The right schedule for you to have regular tests and exams.  Things you can do on your own to prevent diseases and keep yourself healthy. What should I know about diet, weight, and exercise? Eat a healthy diet  Eat a diet that includes plenty of vegetables, fruits, low-fat dairy products, and lean protein.  Do not eat a lot of foods that are high in solid fats, added sugars, or sodium.   Maintain a healthy weight Body mass index (BMI) is used to identify weight problems. It estimates body fat based on height and weight. Your health care provider can help determine your BMI and help you achieve or maintain a healthy weight. Get regular exercise Get regular exercise. This is one of the most important things you can do for your health. Most adults should:  Exercise for at least 150 minutes each week. The exercise should increase your heart rate and make you sweat (moderate-intensity exercise).  Do strengthening exercises at least twice a week. This is in addition to the moderate-intensity exercise.  Spend less time sitting. Even light physical activity can be beneficial. Watch cholesterol and blood lipids Have your blood tested for lipids and cholesterol at 61 years of age, then have this test every 5 years. Have your cholesterol levels checked more often if:  Your lipid or cholesterol levels are high.  You are older than 61 years of age.  You are at high risk for heart disease. What should I know about cancer screening? Depending on your health history and family history, you may need to have cancer screening at various ages. This may include screening for:  Breast cancer.  Cervical cancer.  Colorectal cancer.  Skin cancer.  Lung cancer. What should I know about heart disease, diabetes, and high blood  pressure? Blood pressure and heart disease  High blood pressure causes heart disease and increases the risk of stroke. This is more likely to develop in people who have high blood pressure readings, are of African descent, or are overweight.  Have your blood pressure checked: ? Every 3-5 years if you are 18-39 years of age. ? Every year if you are 40 years old or older. Diabetes Have regular diabetes screenings. This checks your fasting blood sugar level. Have the screening done:  Once every three years after age 40 if you are at a normal weight and have a low risk for diabetes.  More often and at a younger age if you are overweight or have a high risk for diabetes. What should I know about preventing infection? Hepatitis B If you have a higher risk for hepatitis B, you should be screened for this virus. Talk with your health care provider to find out if you are at risk for hepatitis B infection. Hepatitis C Testing is recommended for:  Everyone born from 1945 through 1965.  Anyone with known risk factors for hepatitis C. Sexually transmitted infections (STIs)  Get screened for STIs, including gonorrhea and chlamydia, if: ? You are sexually active and are younger than 61 years of age. ? You are older than 61 years of age and your health care provider tells you that you are at risk for this type of infection. ? Your sexual activity has changed since you were last screened, and you are at increased risk for chlamydia or gonorrhea. Ask your health care provider   if you are at risk.  Ask your health care provider about whether you are at high risk for HIV. Your health care provider may recommend a prescription medicine to help prevent HIV infection. If you choose to take medicine to prevent HIV, you should first get tested for HIV. You should then be tested every 3 months for as long as you are taking the medicine. Pregnancy  If you are about to stop having your period (premenopausal) and  you may become pregnant, seek counseling before you get pregnant.  Take 400 to 800 micrograms (mcg) of folic acid every day if you become pregnant.  Ask for birth control (contraception) if you want to prevent pregnancy. Osteoporosis and menopause Osteoporosis is a disease in which the bones lose minerals and strength with aging. This can result in bone fractures. If you are 61 years old or older, or if you are at risk for osteoporosis and fractures, ask your health care provider if you should:  Be screened for bone loss.  Take a calcium or vitamin D supplement to lower your risk of fractures.  Be given hormone replacement therapy (HRT) to treat symptoms of menopause. Follow these instructions at home: Lifestyle  Do not use any products that contain nicotine or tobacco, such as cigarettes, e-cigarettes, and chewing tobacco. If you need help quitting, ask your health care provider.  Do not use street drugs.  Do not share needles.  Ask your health care provider for help if you need support or information about quitting drugs. Alcohol use  Do not drink alcohol if: ? Your health care provider tells you not to drink. ? You are pregnant, may be pregnant, or are planning to become pregnant.  If you drink alcohol: ? Limit how much you use to 0-1 drink a day. ? Limit intake if you are breastfeeding.  Be aware of how much alcohol is in your drink. In the [place] one drink equals one 12 oz bottle of beer (355 mL), one 5 oz glass of wine (148 mL), or one 1 oz glass of hard liquor (44 mL). General instructions  Schedule regular health, dental, and eye exams.  Stay current with your vaccines.  Tell your health care provider if: ? You often feel depressed. ? You have ever been abused or do not feel safe at home. Summary  Adopting a healthy lifestyle and getting preventive care are important in promoting health and wellness.  Follow your health care provider's instructions about healthy  diet, exercising, and getting tested or screened for diseases.  Follow your health care provider's instructions on monitoring your cholesterol and blood pressure. This information is not intended to replace advice given to you by your health care provider. Make sure you discuss any questions you have with your health care provider. Document Revised: 10/12/2018 Document Reviewed: 10/12/2018 Elsevier Patient Education  2021 Elsevier Inc. Semaglutide injection solution What is this medicine? SEMAGLUTIDE (Sem a GLOO tide) is used to improve blood sugar control in adults with type 2 diabetes. This medicine may be used with other diabetes medicines. This drug may also reduce the risk of heart attack or stroke if you have type 2 diabetes and risk factors for heart disease. This medicine may be used for other purposes; ask your health care provider or pharmacist if you have questions. COMMON BRAND NAME(S): OZEMPIC What should I tell my health care provider before I take this medicine? They need to know if you have any of these conditions: endocrine tumors (MEN  2) or if someone in your family had these tumors eye disease, vision problems history of pancreatitis kidney disease stomach problems thyroid cancer or if someone in your family had thyroid cancer an unusual or allergic reaction to semaglutide, other medicines, foods, dyes, or preservatives pregnant or trying to get pregnant breast-feeding How should I use this medicine? This medicine is for injection under the skin of your upper leg (thigh), stomach area, or upper arm. It is given once every week (every 7 days). You will be taught how to prepare and give this medicine. Use exactly as directed. Take your medicine at regular intervals. Do not take it more often than directed. If you use this medicine with insulin, you should inject this medicine and the insulin separately. Do not mix them together. Do not give the injections right next to each  other. Change (rotate) injection sites with each injection. It is important that you put your used needles and syringes in a special sharps container. Do not put them in a trash can. If you do not have a sharps container, call your pharmacist or healthcare provider to get one. A special MedGuide will be given to you by the pharmacist with each prescription and refill. Be sure to read this information carefully each time. This drug comes with INSTRUCTIONS FOR USE. Ask your pharmacist for directions on how to use this drug. Read the information carefully. Talk to your pharmacist or health care provider if you have questions. Talk to your pediatrician regarding the use of this medicine in children. Special care may be needed. Overdosage: If you think you have taken too much of this medicine contact a poison control center or emergency room at once. NOTE: This medicine is only for you. Do not share this medicine with others. What if I miss a dose? If you miss a dose, take it as soon as you can within 5 days after the missed dose. Then take your next dose at your regular weekly time. If it has been longer than 5 days after the missed dose, do not take the missed dose. Take the next dose at your regular time. Do not take double or extra doses. If you have questions about a missed dose, contact your health care provider for advice. What may interact with this medicine? other medicines for diabetes Many medications may cause changes in blood sugar, these include: alcohol containing beverages antiviral medicines for HIV or AIDS aspirin and aspirin-like drugs certain medicines for blood pressure, heart disease, irregular heart beat chromium diuretics female hormones, such as estrogens or progestins, birth control pills fenofibrate gemfibrozil isoniazid lanreotide female hormones or anabolic steroids MAOIs like Carbex, Eldepryl, Marplan, Nardil, and Parnate medicines for weight loss medicines for  allergies, asthma, cold, or cough medicines for depression, anxiety, or psychotic disturbances niacin nicotine NSAIDs, medicines for pain and inflammation, like ibuprofen or naproxen octreotide pasireotide pentamidine phenytoin probenecid quinolone antibiotics such as ciprofloxacin, levofloxacin, ofloxacin some herbal dietary supplements steroid medicines such as prednisone or cortisone sulfamethoxazole; trimethoprim thyroid hormones Some medications can hide the warning symptoms of low blood sugar (hypoglycemia). You may need to monitor your blood sugar more closely if you are taking one of these medications. These include: beta-blockers, often used for high blood pressure or heart problems (examples include atenolol, metoprolol, propranolol) clonidine guanethidine reserpine This list may not describe all possible interactions. Give your health care provider a list of all the medicines, herbs, non-prescription drugs, or dietary supplements you use. Also tell them if  you smoke, drink alcohol, or use illegal drugs. Some items may interact with your medicine. What should I watch for while using this medicine? Visit your doctor or health care professional for regular checks on your progress. Drink plenty of fluids while taking this medicine. Check with your doctor or health care professional if you get an attack of severe diarrhea, nausea, and vomiting. The loss of too much body fluid can make it dangerous for you to take this medicine. A test called the HbA1C (A1C) will be monitored. This is a simple blood test. It measures your blood sugar control over the last 2 to 3 months. You will receive this test every 3 to 6 months. Learn how to check your blood sugar. Learn the symptoms of low and high blood sugar and how to manage them. Always carry a quick-source of sugar with you in case you have symptoms of low blood sugar. Examples include hard sugar candy or glucose tablets. Make sure others  know that you can choke if you eat or drink when you develop serious symptoms of low blood sugar, such as seizures or unconsciousness. They must get medical help at once. Tell your doctor or health care professional if you have high blood sugar. You might need to change the dose of your medicine. If you are sick or exercising more than usual, you might need to change the dose of your medicine. Do not skip meals. Ask your doctor or health care professional if you should avoid alcohol. Many nonprescription cough and cold products contain sugar or alcohol. These can affect blood sugar. Pens should never be shared. Even if the needle is changed, sharing may result in passing of viruses like hepatitis or HIV. Wear a medical ID bracelet or chain, and carry a card that describes your disease and details of your medicine and dosage times. Do not become pregnant while taking this medicine. Women should inform their doctor if they wish to become pregnant or think they might be pregnant. There is a potential for serious side effects to an unborn child. Talk to your health care professional or pharmacist for more information. What side effects may I notice from receiving this medicine? Side effects that you should report to your doctor or health care professional as soon as possible: allergic reactions like skin rash, itching or hives, swelling of the face, lips, or tongue breathing problems changes in vision diarrhea that continues or is severe lump or swelling on the neck severe nausea signs and symptoms of infection like fever or chills; cough; sore throat; pain or trouble passing urine signs and symptoms of low blood sugar such as feeling anxious, confusion, dizziness, increased hunger, unusually weak or tired, sweating, shakiness, cold, irritable, headache, blurred vision, fast heartbeat, loss of consciousness signs and symptoms of kidney injury like trouble passing urine or change in the amount of  urine trouble swallowing unusual stomach upset or pain vomiting Side effects that usually do not require medical attention (report to your doctor or health care professional if they continue or are bothersome): constipation diarrhea nausea pain, redness, or irritation at site where injected stomach upset This list may not describe all possible side effects. Call your doctor for medical advice about side effects. You may report side effects to FDA at 1-800-FDA-1088. Where should I keep my medicine? Keep out of the reach of children. Store unopened pens in a refrigerator between 2 and 8 degrees C (36 and 46 degrees F). Do not freeze. Protect from light  and heat. After you first use the pen, it can be stored for 56 days at room temperature between 15 and 30 degrees C (59 and 86 degrees F) or in a refrigerator. Throw away your used pen after 56 days or after the expiration date, whichever comes first. Do not store your pen with the needle attached. If the needle is left on, medicine may leak from the pen. NOTE: This sheet is a summary. It may not cover all possible information. If you have questions about this medicine, talk to your doctor, pharmacist, or health care provider.  2021 Elsevier/Gold Standard (2019-07-04 09:41:51)

## 2021-03-03 NOTE — Progress Notes (Signed)
Renee Burns,   Thyroid looks great.  Kidney and liver look great.  Your fasting glucose was just a hair elevated. I would like to add A1C to see what your average blood suar in running.  LDL, your bad cholesterol, continues to rise.  Your good cholesterol is amazing.  Overall 10 year CV risk is 3.1 percent and still pretty low. We suggest cholesterol medication when above 7.5 percent.  Continue to work on [employer] and exercise.

## 2021-03-04 ENCOUNTER — Encounter: Payer: Self-pay | Admitting: Physician Assistant

## 2021-03-04 DIAGNOSIS — R7303 Prediabetes: Secondary | ICD-10-CM | POA: Insufficient documentation

## 2021-03-04 LAB — COMPLETE METABOLIC PANEL WITH GFR
AG Ratio: 1.6 (calc) (ref 1.0–2.5)
ALT: 21 U/L (ref 6–29)
AST: 21 U/L (ref 10–35)
Albumin: 4.4 g/dL (ref 3.6–5.1)
Alkaline phosphatase (APISO): 86 U/L (ref 37–153)
BUN: 14 mg/dL (ref 7–25)
CO2: 26 mmol/L (ref 20–32)
Calcium: 9.8 mg/dL (ref 8.6–10.4)
Chloride: 109 mmol/L (ref 98–110)
Creat: 0.91 mg/dL (ref 0.50–0.99)
GFR, Est African American: 79 mL/min/{1.73_m2} (ref >=60)
GFR, Est Non African American: 69 mL/min/{1.73_m2} (ref >=60)
Globulin: 2.8 g/dL (calc) (ref 1.9–3.7)
Glucose, Bld: 101 mg/dL — ABNORMAL HIGH (ref 65–99)
Potassium: 4.8 mmol/L (ref 3.5–5.3)
Sodium: 143 mmol/L (ref 135–146)
Total Bilirubin: 0.4 mg/dL (ref 0.2–1.2)
Total Protein: 7.2 g/dL (ref 6.1–8.1)

## 2021-03-04 LAB — CBC WITH DIFFERENTIAL/PLATELET
Absolute Monocytes: 403 cells/uL (ref 200–950)
Basophils Absolute: 61 cells/uL (ref 0–200)
Basophils Relative: 1 %
Eosinophils Absolute: 122 cells/uL (ref 15–500)
Eosinophils Relative: 2 %
HCT: 39.6 % (ref 35.0–45.0)
Hemoglobin: 12.2 g/dL (ref 11.7–15.5)
Lymphs Abs: 1269 cells/uL (ref 850–3900)
MCH: 24.8 pg — ABNORMAL LOW (ref 27.0–33.0)
MCHC: 30.8 g/dL — ABNORMAL LOW (ref 32.0–36.0)
MCV: 80.7 fL (ref 80.0–100.0)
MPV: 10.9 fL (ref 7.5–12.5)
Monocytes Relative: 6.6 %
Neutro Abs: 4246 cells/uL (ref 1500–7800)
Neutrophils Relative %: 69.6 %
Platelets: 388 10*3/uL (ref 140–400)
RBC: 4.91 10*6/uL (ref 3.80–5.10)
RDW: 15.1 % — ABNORMAL HIGH (ref 11.0–15.0)
Total Lymphocyte: 20.8 %
WBC: 6.1 10*3/uL (ref 3.8–10.8)

## 2021-03-04 LAB — LIPID PANEL W/REFLEX DIRECT LDL
Cholesterol: 261 mg/dL — ABNORMAL HIGH (ref <200)
HDL: 69 mg/dL (ref >=50)
LDL Cholesterol (Calc): 167 mg/dL (calc) — ABNORMAL HIGH
Non-HDL Cholesterol (Calc): 192 mg/dL (calc) — ABNORMAL HIGH (ref <130)
Total CHOL/HDL Ratio: 3.8 (calc) (ref <5.0)
Triglycerides: 120 mg/dL (ref <150)

## 2021-03-04 LAB — HEMOGLOBIN A1C W/OUT EAG: Hgb A1c MFr Bld: 5.8 % of total Hgb — ABNORMAL HIGH (ref <5.7)

## 2021-03-04 LAB — TSH: TSH: 2.19 mIU/L (ref 0.40–4.50)

## 2021-03-04 NOTE — Progress Notes (Signed)
A1C is 5.8 and just in the pre-diabetes range. This is a range to take caution with diet and exercise to try to prevent type 2 diabetes from forming. We can monitor this number in 6 months to see if trending up or down.

## 2021-03-06 ENCOUNTER — Ambulatory Visit: Payer: BC Managed Care – PPO

## 2021-03-12 ENCOUNTER — Ambulatory Visit (INDEPENDENT_AMBULATORY_CARE_PROVIDER_SITE_OTHER): Payer: BC Managed Care – PPO

## 2021-03-12 ENCOUNTER — Other Ambulatory Visit: Payer: Self-pay

## 2021-03-12 DIAGNOSIS — Z1231 Encounter for screening mammogram for malignant neoplasm of breast: Secondary | ICD-10-CM | POA: Diagnosis not present

## 2021-03-12 NOTE — Progress Notes (Signed)
Normal mammogram. Follow up in 1 year.

## 2021-04-15 ENCOUNTER — Telehealth: Payer: Self-pay | Admitting: *Deleted

## 2021-04-15 NOTE — Telephone Encounter (Signed)
PA started for [facility] .25mg  through cover my meds.  Awaiting response.

## 2021-06-27 ENCOUNTER — Telehealth: Payer: Self-pay | Admitting: General Practice

## 2021-06-27 DIAGNOSIS — N23 Unspecified renal colic: Secondary | ICD-10-CM | POA: Diagnosis not present

## 2021-06-27 DIAGNOSIS — Z7982 Long term (current) use of aspirin: Secondary | ICD-10-CM | POA: Diagnosis not present

## 2021-06-27 DIAGNOSIS — K7689 Other specified diseases of liver: Secondary | ICD-10-CM | POA: Diagnosis not present

## 2021-06-27 DIAGNOSIS — Z9049 Acquired absence of other specified parts of digestive tract: Secondary | ICD-10-CM | POA: Diagnosis not present

## 2021-06-27 DIAGNOSIS — Z888 Allergy status to other drugs, medicaments and biological substances status: Secondary | ICD-10-CM | POA: Diagnosis not present

## 2021-06-27 DIAGNOSIS — K449 Diaphragmatic hernia without obstruction or gangrene: Secondary | ICD-10-CM | POA: Diagnosis not present

## 2021-06-27 DIAGNOSIS — N132 Hydronephrosis with renal and ureteral calculous obstruction: Secondary | ICD-10-CM | POA: Diagnosis not present

## 2021-06-27 DIAGNOSIS — R112 Nausea with vomiting, unspecified: Secondary | ICD-10-CM | POA: Diagnosis not present

## 2021-06-27 DIAGNOSIS — Z79899 Other long term (current) drug therapy: Secondary | ICD-10-CM | POA: Diagnosis not present

## 2021-06-27 DIAGNOSIS — R109 Unspecified abdominal pain: Secondary | ICD-10-CM | POA: Diagnosis not present

## 2021-06-27 DIAGNOSIS — E7800 Pure hypercholesterolemia, unspecified: Secondary | ICD-10-CM | POA: Diagnosis not present

## 2021-06-27 NOTE — Telephone Encounter (Signed)
Transition Care Management Unsuccessful Follow-up Telephone Call  Date of discharge and from where:  06/27/21 from [facility]  Attempts:  1st Attempt  Reason for unsuccessful TCM follow-up call:  Left voice message

## 2021-06-30 NOTE — Telephone Encounter (Signed)
Transition Care Management Follow-up Telephone Call Date of discharge and from where: 06/27/21 from [facility] How have you been since you were released from the hospital? Doing better. Any questions or concerns? No  Items Reviewed: Did the pt receive and understand the discharge instructions provided? Yes  Medications obtained and verified? Yes  Other? No  Any new allergies since your discharge? No  Dietary orders reviewed? Yes Do you have support at home? Yes   Home Care and Equipment/Supplies: Were home health services ordered? no   Functional Questionnaire: (I = Independent and D = Dependent) ADLs: I  Bathing/Dressing- I  Meal Prep- I  Eating- I  Maintaining continence- I  Transferring/Ambulation- I  Managing Meds- I  Follow up appointments reviewed:  PCP Hospital f/u appt confirmed? No  Patient stated that she has passed the kidney stone and didn't need to schedule a follow up at this time. Specialist Hospital f/u appt confirmed? No   Are transportation arrangements needed? No  If their condition worsens, is the pt aware to call PCP or go to the Emergency Dept.? Yes Was the patient provided with contact information for the PCP's office or ED? Yes Was to pt encouraged to call back with questions or concerns? Yes

## 2021-10-06 ENCOUNTER — Other Ambulatory Visit: Payer: Self-pay | Admitting: Physician Assistant

## 2021-10-06 DIAGNOSIS — J45991 Cough variant asthma: Secondary | ICD-10-CM

## 2021-10-09 ENCOUNTER — Ambulatory Visit (INDEPENDENT_AMBULATORY_CARE_PROVIDER_SITE_OTHER): Payer: BC Managed Care – PPO | Admitting: Family Medicine

## 2021-10-09 ENCOUNTER — Other Ambulatory Visit: Payer: Self-pay

## 2021-10-09 ENCOUNTER — Encounter: Payer: Self-pay | Admitting: Family Medicine

## 2021-10-09 VITALS — BP 128/88 | HR 88 | Temp 99.3°F | Ht 60.0 in | Wt 175.0 lb

## 2021-10-09 DIAGNOSIS — J329 Chronic sinusitis, unspecified: Secondary | ICD-10-CM

## 2021-10-09 DIAGNOSIS — J40 Bronchitis, not specified as acute or chronic: Secondary | ICD-10-CM

## 2021-10-09 MED ORDER — PREDNISONE 20 MG PO TABS: 40.0000 mg | ORAL_TABLET | Freq: Every day | ORAL | 0 refills | Status: AC

## 2021-10-09 MED ORDER — DOXYCYCLINE HYCLATE 100 MG PO TABS: 100.0000 mg | ORAL_TABLET | Freq: Two times a day (BID) | ORAL | 0 refills | Status: AC

## 2021-10-09 NOTE — Progress Notes (Signed)
Acute Office Visit  Subjective:    Patient ID: Renee Burns, female    DOB: 02/20/1961, 60 y.o.   MRN: [id number]  Chief Complaint  Patient presents with   Cough    HPI Patient is in today for cough and sinus pressure.   Patient states she has had >2 weeks of cough and sinus pressure. States coughing fits get so bad she throws up or feels like she is going to pass out. She does have a history of asthma, but reports minimal wheezing. States she can hear a "rattle" in her upper chest/throat, has sinus pressure/pain and rhinorrhea for the 2 week duration as well. She denies any blood in sputum, fevers, nausea, vomiting, diarrhea, rashes, fatigue, body aches, chest pain, shortness of breath apart from coughing fits.       Past Medical History:  Diagnosis Date   Anxiety    Asthma    Hyperlipidemia    Ovarian cyst    Pneumonia    Ulcerative colitis ([place])     Past Surgical History:  Procedure Laterality Date   CESAREAN SECTION     CHOLECYSTECTOMY     COLOSTOMY      Family History  Problem Relation Age of Onset   Heart disease Father    Cancer Father        bladder CA   Diabetes Mother     Social History   Socioeconomic History   Marital status: Married    Spouse name: Not on file   Number of children: Not on file   Years of education: Not on file   Highest education level: Not on file  Occupational History   Occupation: Art gallery manager  Tobacco Use   Smoking status: Never   Smokeless tobacco: Never  Substance and Sexual Activity   Alcohol use: No   Drug use: No   Sexual activity: Yes  Other Topics Concern   Not on file  Social History Narrative   Not on file   Social Determinants of Health   Financial Resource Strain: Not on file  Food Insecurity: Not on file  Transportation Needs: Not on file  Physical Activity: Not on file  Stress: Not on file  Social Connections: Not on file  Intimate Partner Violence: Not on file    Outpatient Medications Prior  to Visit  Medication Sig Dispense Refill   albuterol (VENTOLIN HFA) 108 (90 Base) MCG/ACT inhaler Inhale 2 puffs into the lungs every 6 (six) hours as needed for wheezing or shortness of breath. **PATIENT NEEDS OFFICE VISIT FOR ADDITIONAL REFILLS** 18 each 0   buPROPion (WELLBUTRIN SR) 150 MG 12 hr tablet Take 1 tablet (150 mg total) by mouth 2 (two) times daily. 180 tablet 3   escitalopram (LEXAPRO) 10 MG tablet Take 1 tablet (10 mg total) by mouth daily. 90 tablet 3   fluticasone (FLONASE) 50 MCG/ACT nasal spray Place 2 sprays into both nostrils daily. 16 g 6   fluticasone (FLOVENT HFA) 44 MCG/ACT inhaler Inhale 2 puffs into the lungs 2 (two) times daily. 1 each 11   Multiple Minerals-Vitamins (CALCIUM CITRATE +) TABS Take by mouth daily.     Multiple Vitamins-Minerals (WOMENS MULTIVITAMIN PLUS) TABS Take 1 tablet by mouth.     Semaglutide-Weight Management (WEGOVY) 0.25 MG/0.5ML SOAJ Inject 0.25 mg into the skin once a week. 2 mL 0   No facility-administered medications prior to visit.    Allergies  Allergen Reactions   Augmentin [Amoxicillin-Pot Clavulanate]    Loracarbef Rash  Review of Systems All review of systems negative except what is listed in the HPI     Objective:    Physical Exam Vitals reviewed.  Constitutional:      Appearance: Normal appearance.  HENT:     Nose: Congestion present.     Mouth/Throat:     Mouth: Mucous membranes are moist.     Pharynx: Oropharynx is clear.  Cardiovascular:     Rate and Rhythm: Normal rate and regular rhythm.  Pulmonary:     Effort: Pulmonary effort is normal.     Breath sounds: Normal breath sounds. No wheezing, rhonchi or rales.  Musculoskeletal:     Cervical back: Normal range of motion and neck supple. No tenderness.  Lymphadenopathy:     Cervical: No cervical adenopathy.  Skin:    General: Skin is warm and dry.  Neurological:     General: No focal deficit present.     Mental Status: She is alert and oriented to  person, place, and time. Mental status is at baseline.  Psychiatric:        Mood and Affect: Mood normal.        Behavior: Behavior normal.        Thought Content: Thought content normal.        Judgment: Judgment normal.    BP 128/88 (BP Location: Left Arm, Patient Position: Sitting, Cuff Size: Normal)   Pulse 88   Temp 99.3 F (37.4 C) (Oral)   Ht 5' (1.524 m)   Wt 175 lb (79.4 kg)   LMP 08/12/2012   SpO2 96%   BMI 34.18 kg/m  Wt Readings from Last 3 Encounters:  10/09/21 175 lb (79.4 kg)  02/28/21 170 lb (77.1 kg)  02/21/20 152 lb (68.9 kg)    Health Maintenance Due  Topic Date Due   PAP SMEAR-Modifier  06/12/2017    There are no preventive care reminders to display for this patient.   Lab Results  Component Value Date   TSH 2.19 02/28/2021   Lab Results  Component Value Date   WBC 6.1 02/28/2021   HGB 12.2 02/28/2021   HCT 39.6 02/28/2021   MCV 80.7 02/28/2021   PLT 388 02/28/2021   Lab Results  Component Value Date   NA 143 02/28/2021   K 4.8 02/28/2021   CO2 26 02/28/2021   GLUCOSE 101 (H) 02/28/2021   BUN 14 02/28/2021   CREATININE 0.91 02/28/2021   BILITOT 0.4 02/28/2021   ALKPHOS 85 12/14/2015   AST 21 02/28/2021   ALT 21 02/28/2021   PROT 7.2 02/28/2021   CALCIUM 9.8 02/28/2021   Lab Results  Component Value Date   CHOL 261 (H) 02/28/2021   Lab Results  Component Value Date   HDL 69 02/28/2021   Lab Results  Component Value Date   LDLCALC 167 (H) 02/28/2021   Lab Results  Component Value Date   TRIG 120 02/28/2021   Lab Results  Component Value Date   CHOLHDL 3.8 02/28/2021   Lab Results  Component Value Date   HGBA1C 5.8 (H) 02/28/2021       Assessment & Plan:     1. Sinobronchitis Start antibiotics and oral steroids. Continue supportive measures including rest, hydration, humidifier use, steam showers, warm compresses to sinuses, warm liquids with lemon and honey, and over-the-counter cough, cold, and analgesics as  needed. Patient aware of signs/symptoms requiring further/urgent evaluation.   - doxycycline (VIBRA-TABS) 100 MG tablet; Take 1 tablet (100 mg total) by mouth  2 (two) times daily for 7 days.  Dispense: 14 tablet; Refill: 0 - predniSONE (DELTASONE) 20 MG tablet; Take 2 tablets (40 mg total) by mouth daily with breakfast for 5 days.  Dispense: 10 tablet; Refill: 0   Follow-up if symptoms worsen or fail to improve.   Purcell Nails Olevia Bowens, DNP, FNP-C

## 2021-10-09 NOTE — Patient Instructions (Signed)
Continue supportive measures including rest, hydration, humidifier use, steam showers, warm compresses to sinuses, warm liquids with lemon and honey, and over-the-counter cough, cold, and analgesics as needed.   ° °

## 2021-10-28 ENCOUNTER — Encounter: Payer: Self-pay | Admitting: Family Medicine

## 2021-10-28 ENCOUNTER — Telehealth (INDEPENDENT_AMBULATORY_CARE_PROVIDER_SITE_OTHER): Payer: BC Managed Care – PPO | Admitting: Family Medicine

## 2021-10-28 DIAGNOSIS — J0100 Acute maxillary sinusitis, unspecified: Secondary | ICD-10-CM

## 2021-10-28 MED ORDER — HYDROCOD POLST-CPM POLST ER 10-8 MG/5ML PO SUER: 5.0000 mL | Freq: Two times a day (BID) | ORAL | 0 refills | Status: AC | PRN

## 2021-10-28 MED ORDER — AZITHROMYCIN 250 MG PO TABS: ORAL_TABLET | ORAL | 0 refills | Status: DC

## 2021-10-28 NOTE — Progress Notes (Signed)
Virtual Visit via Telephone Note  I connected with  Renee Burns on 10/28/21 at  1:20 PM EST by telephone and verified that I am speaking with the correct person using two identifiers.   I discussed the limitations, risks, security and privacy concerns of performing an evaluation and management service by telephone and the availability of in person appointments. I also discussed with the patient that there may be a patient responsible charge related to this service. The patient expressed understanding and agreed to proceed.  Participating parties included in this telephone visit include: The patient and the nurse practitioner listed.  The patient is: At home I am: In the office  Subjective:    CC: sinusitis  HPI: Renee Burns is a 60 y.o. year old female presenting today via telephone visit to discuss sinusitis.  10/09/21 Office visit: Sinusitis x2 weeks. Treated with doxycycline and prednisone.   Today she reports that she only had a few days of improvement in symptoms after finishing prescriptions before symptoms returned. She continues to have significant nasal congestion/pressure, productive cough that keeps her up at night. She denies any chest pain, dyspnea, wheezing, tachypnea, palpitations, fevers, headaches.      Past medical history, Surgical history, Family history not pertinant except as noted below, Social history, Allergies, and medications have been entered into the medical record, reviewed, and corrections made.   Review of Systems:  All review of systems negative except what is listed in the HPI  Objective:    General:  Patient speaking clearly in complete sentences. No shortness of breath noted.   Alert and oriented x3.   Normal judgment.  No apparent acute distress.  Impression and Recommendations:    1. Acute non-recurrent maxillary sinusitis - azithromycin (ZITHROMAX Z-PAK) 250 MG tablet; Take 2 tablets (500 mg) on  Day 1,  followed by 1 tablet (250  mg) once daily on Days 2 through 5.  Dispense: 6 tablet; Refill: 0 - chlorpheniramine-HYDROcodone (TUSSIONEX) 10-8 MG/5ML SUER; Take 5 mLs by mouth every 12 (twelve) hours as needed for up to 5 days for cough (cough, will cause drowsiness.).  Dispense: 50 mL; Refill: 0  Will try switching to a Zpak to see if she gets some improvement with this. Adding Tussionex to help with bedtime cough so she can get some sleep. Continue supportive measures including rest, hydration, humidifier use, steam showers, warm compresses to sinuses, warm liquids with lemon and honey, and over-the-counter cough, cold, and analgesics as needed.  Patient aware of signs/symptoms requiring further/urgent evaluation. If symptoms persist despite medications, follow-up in person for thorough assessment.   Follow-up if symptoms worsen or fail to improve.     I discussed the assessment and treatment plan with the patient. The patient was provided an opportunity to ask questions and all were answered. The patient agreed with the plan and demonstrated an understanding of the instructions.   The patient was advised to call back or seek an in-person evaluation if the symptoms worsen or if the condition fails to improve as anticipated.  I provided 20 minutes of non-face-to-face time during this TELEPHONE encounter.    Renee Dana, NP

## 2022-03-09 ENCOUNTER — Other Ambulatory Visit: Payer: Self-pay | Admitting: Physician Assistant

## 2022-03-09 DIAGNOSIS — F419 Anxiety disorder, unspecified: Secondary | ICD-10-CM

## 2022-03-09 DIAGNOSIS — J45991 Cough variant asthma: Secondary | ICD-10-CM

## 2022-03-09 DIAGNOSIS — F339 Major depressive disorder, recurrent, unspecified: Secondary | ICD-10-CM

## 2022-03-17 ENCOUNTER — Ambulatory Visit (INDEPENDENT_AMBULATORY_CARE_PROVIDER_SITE_OTHER): Payer: BC Managed Care – PPO | Admitting: Physician Assistant

## 2022-03-17 ENCOUNTER — Other Ambulatory Visit (HOSPITAL_COMMUNITY)
Admission: RE | Admit: 2022-03-17 | Discharge: 2022-03-17 | Disposition: A | Discharge status: Home / Self Care | Payer: BC Managed Care – PPO | Source: Ambulatory Visit | Attending: Physician Assistant | Admitting: Physician Assistant

## 2022-03-17 ENCOUNTER — Encounter: Payer: Self-pay | Admitting: Physician Assistant

## 2022-03-17 VITALS — BP 137/81 | HR 92 | Ht 60.0 in | Wt 167.0 lb

## 2022-03-17 DIAGNOSIS — Z1231 Encounter for screening mammogram for malignant neoplasm of breast: Secondary | ICD-10-CM

## 2022-03-17 DIAGNOSIS — Z0000 Encounter for general adult medical examination without abnormal findings: Secondary | ICD-10-CM

## 2022-03-17 DIAGNOSIS — F419 Anxiety disorder, unspecified: Secondary | ICD-10-CM

## 2022-03-17 DIAGNOSIS — Z124 Encounter for screening for malignant neoplasm of cervix: Secondary | ICD-10-CM | POA: Diagnosis not present

## 2022-03-17 DIAGNOSIS — Z13220 Encounter for screening for lipoid disorders: Secondary | ICD-10-CM | POA: Diagnosis not present

## 2022-03-17 DIAGNOSIS — E7800 Pure hypercholesterolemia, unspecified: Secondary | ICD-10-CM

## 2022-03-17 DIAGNOSIS — Z1329 Encounter for screening for other suspected endocrine disorder: Secondary | ICD-10-CM

## 2022-03-17 DIAGNOSIS — Z131 Encounter for screening for diabetes mellitus: Secondary | ICD-10-CM | POA: Diagnosis not present

## 2022-03-17 DIAGNOSIS — Z6832 Body mass index (BMI) 32.0-32.9, adult: Secondary | ICD-10-CM

## 2022-03-17 DIAGNOSIS — J45991 Cough variant asthma: Secondary | ICD-10-CM

## 2022-03-17 DIAGNOSIS — R1900 Intra-abdominal and pelvic swelling, mass and lump, unspecified site: Secondary | ICD-10-CM

## 2022-03-17 DIAGNOSIS — M8580 Other specified disorders of bone density and structure, unspecified site: Secondary | ICD-10-CM

## 2022-03-17 DIAGNOSIS — R7303 Prediabetes: Secondary | ICD-10-CM

## 2022-03-17 DIAGNOSIS — E6609 Other obesity due to excess calories: Secondary | ICD-10-CM

## 2022-03-17 DIAGNOSIS — F339 Major depressive disorder, recurrent, unspecified: Secondary | ICD-10-CM

## 2022-03-17 MED ORDER — FLUTICASONE PROPIONATE HFA 44 MCG/ACT IN AERO: 2.0000 | INHALATION_SPRAY | Freq: Two times a day (BID) | RESPIRATORY_TRACT | 11 refills | Status: AC

## 2022-03-17 MED ORDER — ESCITALOPRAM OXALATE 10 MG PO TABS: 10.0000 mg | ORAL_TABLET | Freq: Every day | ORAL | 3 refills | Status: DC

## 2022-03-17 MED ORDER — ALBUTEROL SULFATE HFA 108 (90 BASE) MCG/ACT IN AERS: 2.0000 | INHALATION_SPRAY | Freq: Four times a day (QID) | RESPIRATORY_TRACT | 1 refills | Status: DC | PRN

## 2022-03-17 MED ORDER — BUPROPION HCL ER (SR) 150 MG PO TB12: 150.0000 mg | ORAL_TABLET | Freq: Two times a day (BID) | ORAL | 3 refills | Status: DC

## 2022-03-17 NOTE — Progress Notes (Signed)
? ?Complete physical exam ? ?Patient: Renee Burns   DOB: 07-27-1961   61 y.o. Female  MRN: [id number] ? ?Subjective:  ?  ?Chief Complaint  ?Patient presents with  ? Annual Exam  ? ? ?Renee Burns is a 61 y.o. female who presents today for a complete physical exam. She reports consuming a general diet.  She goes to the gym, hikes, ride bikes and stays active with her grandchildren  She generally feels well. She reports sleeping well. She does have additional problems to discuss today.  ? ?She has noticed some pelvic fullness more on right than left for the last month.  No bowel changes. No constipation. No painful intercourse. No dysuria. Lost 8lbs but has been trying over the last year.  ? ?Most recent fall risk assessment: ? ?  03/17/2022  ?  8:23 AM  ?Fall Risk   ?Falls in the past year? 0  ?Number falls in past yr: 0  ?Injury with Fall? 0  ?Risk for fall due to : No Fall Risks  ?Follow up Falls evaluation completed  ? ?  ?Most recent depression screenings: ? ?  03/17/2022  ?  9:06 AM 02/28/2021  ?  9:31 AM  ?PHQ 2/9 Scores  ?PHQ - 2 Score 2 2  ?PHQ- 9 Score 4 6  ? ? ?Vision:Within last year ? ?Patient Active Problem List  ? Diagnosis Date Noted  ? Pelvic fullness in female 03/17/2022  ? Osteopenia 03/17/2022  ? Prediabetes 03/04/2021  ? Elevated fasting glucose 02/22/2020  ? Class 1 obesity due to excess calories without serious comorbidity with body mass index (BMI) of 32.0 to 32.9 in adult 11/06/2017  ? Anxiety 11/06/2017  ? Macular degeneration, right eye 11/05/2017  ? Overweight (BMI 25.0-29.9) 05/22/2016  ? H/O colostomy 05/22/2016  ? Abnormal weight gain 12/07/2014  ? Hypercholesteremia 06/12/2014  ? Asthma, cough variant 06/12/2014  ? Affective disorder (HCC) 06/12/2014  ? Depression, recurrent (HCC) 06/12/2014  ? H/O chronic ulcerative colitis 01/25/2013  ? ?Past Medical History:  ?Diagnosis Date  ? Anxiety   ? Asthma   ? Hyperlipidemia   ? Ovarian cyst   ? Pneumonia   ? Ulcerative colitis (HCC)    ? ?Family History  ?Problem Relation Age of Onset  ? Heart disease Father   ? Cancer Father   ?     bladder CA  ? Diabetes Mother   ? ?Allergies  ?Allergen Reactions  ? Augmentin [Amoxicillin-Pot Clavulanate]   ? Loracarbef Rash  ? ?  ? ?Patient Care Team: ?Nolene Ebbs as PCP - General (Family Medicine)  ? ?Outpatient Medications Prior to Visit  ?Medication Sig  ? fluticasone (FLONASE) 50 MCG/ACT nasal spray Place 2 sprays into both nostrils daily.  ? Multiple Minerals-Vitamins (CALCIUM CITRATE +) TABS Take by mouth daily.  ? Multiple Vitamins-Minerals (WOMENS MULTIVITAMIN PLUS) TABS Take 1 tablet by mouth.  ? [DISCONTINUED] albuterol (VENTOLIN HFA) 108 (90 Base) MCG/ACT inhaler INHALE 2 PUFFS INTO THE LUNGS EVERY 6 (SIX) HOURS AS NEEDED FOR WHEEZING OR SHORTNESS OF BREATH. **PATIENT NEEDS OFFICE VISIT FOR ADDITIONAL REFILLS**  ? [DISCONTINUED] buPROPion (WELLBUTRIN SR) 150 MG 12 hr tablet Take 1 tablet (150 mg total) by mouth 2 (two) times daily.  ? [DISCONTINUED] escitalopram (LEXAPRO) 10 MG tablet Take 1 tablet (10 mg total) by mouth daily. APPT FOR FURTHER REFILLS  ? [DISCONTINUED] fluticasone (FLOVENT HFA) 44 MCG/ACT inhaler Inhale 2 puffs into the lungs 2 (two) times daily.  ? [DISCONTINUED] azithromycin (ZITHROMAX  Z-PAK) 250 MG tablet Take 2 tablets (500 mg) on  Day 1,  followed by 1 tablet (250 mg) once daily on Days 2 through 5.  ? [DISCONTINUED] Semaglutide-Weight Management (WEGOVY) 0.25 MG/0.5ML SOAJ Inject 0.25 mg into the skin once a week.  ? ?No facility-administered medications prior to visit.  ? ? ?Review of Systems  ?All other systems reviewed and are negative. ? ? ? ? ?   ?Objective:  ? ?  ?BP 137/81   Pulse 92   Ht 5' (1.524 m)   Wt 167 lb (75.8 kg)   LMP 08/12/2012   SpO2 98%   BMI 32.61 kg/m?  ?BP Readings from Last 3 Encounters:  ?03/17/22 137/81  ?10/09/21 128/88  ?02/28/21 135/80  ? ?  ? ?Physical Exam  ?BP 137/81   Pulse 92   Ht 5' (1.524 m)   Wt 167 lb (75.8 kg)   LMP  08/12/2012   SpO2 98%   BMI 32.61 kg/m?  ? ?General Appearance:    Alert, cooperative, no distress, appears stated age  ?Head:    Normocephalic, without obvious abnormality, atraumatic  ?Eyes:    PERRL, conjunctiva/corneas clear, EOM's intact, fundi  ?  benign, both eyes  ?Ears:    Normal TM's and external ear canals, both ears  ?Nose:   Nares normal, septum midline, mucosa normal, no drainage    or sinus tenderness  ?Throat:   Lips, mucosa, and tongue normal; teeth and gums normal  ?Neck:   Supple, symmetrical, trachea midline, no adenopathy;  ?  thyroid:  no enlargement/tenderness/nodules; no carotid ?  bruit or JVD  ?Back:     Symmetric, no curvature, ROM normal, no CVA tenderness  ?Lungs:     Clear to auscultation bilaterally, respirations unlabored  ?Chest Wall:    No tenderness or deformity  ? Heart:    Regular rate and rhythm, S1 and S2 normal, no murmur, rub   or gallop  ?   ?Abdomen:     Soft, non-tender, bowel sounds active all four quadrants,  ?  no masses, no organomegaly  ?Genitalia:    Normal female without lesion, discharge or tenderness. No mass vaginally. No adnexal tenderness.   ?   ?Extremities:   Extremities normal, atraumatic, no cyanosis or edema  ?Pulses:   2+ and symmetric all extremities  ?Skin:   Skin color, texture, turgor normal, no rashes or lesions  ?Lymph nodes:   Cervical, supraclavicular, and axillary nodes normal  ?Neurologic:   CNII-XII intact, normal strength, sensation and reflexes  ?  throughout  ? ? ?   ?Assessment & Plan:  ?  ?Routine Health Maintenance and Physical Exam ? ?Immunization History  ?Administered Date(s) Administered  ? Influenza Split 08/21/2010  ? Influenza, Seasonal, Injecte, Preservative Fre 10/10/2013  ? Influenza,inj,Quad PF,6+ Mos 11/05/2017  ? Influenza-Unspecified 10/12/2011, 07/29/2012  ? PFIZER Comirnaty(Gray Top)Covid-19 Tri-Sucrose Vaccine 11/15/2020, 12/06/2020  ? Pneumococcal Polysaccharide-23 08/14/2009  ? Td 08/11/2004  ? Zoster Recombinat  (Shingrix) 12/01/2018, 02/23/2019  ? ? ?Health Maintenance  ?Topic Date Due  ? PAP SMEAR-Modifier  06/12/2017  ? COVID-19 Vaccine (3 - Booster for Pfizer series) 04/02/2022 (Originally 01/31/2021)  ? HIV Screening  03/18/2023 (Originally 04/26/1975)  ? INFLUENZA VACCINE  06/02/2022  ? MAMMOGRAM  03/13/2023  ? TETANUS/TDAP  11/02/2025  ? Hepatitis C Screening  Completed  ? Zoster Vaccines- Shingrix  Completed  ? HPV VACCINES  Aged Out  ? ? ?Discussed health benefits of physical activity, and encouraged her to  engage in regular exercise appropriate for her age and condition. ? ?.Renee Burns was seen today for annual exam. ? ?Diagnoses and all orders for this visit: ? ?Routine physical examination ?-     TSH ?-     Lipid Panel w/reflex Direct LDL ?-     COMPLETE METABOLIC PANEL WITH GFR ?-     CBC with Differential/Platelet ?-     Hemoglobin A1c ? ?Screening for diabetes mellitus ?-     COMPLETE METABOLIC PANEL WITH GFR ?-     Hemoglobin A1c ? ?Screening for lipid disorders ?-     Lipid Panel w/reflex Direct LDL ? ?Hypercholesteremia ?-     Lipid Panel w/reflex Direct LDL ? ?Prediabetes ?-     COMPLETE METABOLIC PANEL WITH GFR ?-     Hemoglobin A1c ? ?Thyroid disorder screen ?-     TSH ? ?Papanicolaou smear ?-     Cancel: Cytology - PAP ?-     Cytology - PAP ? ?Pelvic fullness in female ?-     US Pelvic Complete With Transvaginal; Future ? ?Depression, recurrent (HCC) ?-     buPROPion (WELLBUTRIN SR) 150 MG 12 hr tablet; Take 1 tablet (150 mg total) by mouth 2 (two) times daily. ?-     escitalopram (LEXAPRO) 10 MG tablet; Take 1 tablet (10 mg total) by mouth daily. ? ?Anxiety ?-     buPROPion (WELLBUTRIN SR) 150 MG 12 hr tablet; Take 1 tablet (150 mg total) by mouth 2 (two) times daily. ?-     escitalopram (LEXAPRO) 10 MG tablet; Take 1 tablet (10 mg total) by mouth daily. ? ?Asthma, cough variant ?-     fluticasone (FLOVENT HFA) 44 MCG/ACT inhaler; Inhale 2 puffs into the lungs 2 (two) times daily. ?-     albuterol  (VENTOLIN HFA) 108 (90 Base) MCG/ACT inhaler; Inhale 2 puffs into the lungs every 6 (six) hours as needed for wheezing or shortness of breath. ? ?Osteopenia, unspecified location ?-     VITAMIN D 25 Hydroxy (Vit-D Deficiency, Fra

## 2022-03-17 NOTE — Patient Instructions (Addendum)
[employer] ? ?Will get pelvic ultrasound ? ?Health Maintenance, Female ?Adopting a healthy lifestyle and getting preventive care are important in promoting health and wellness. Ask your health care provider about: ?The right schedule for you to have regular tests and exams. ?Things you can do on your own to prevent diseases and keep yourself healthy. ?What should I know about diet, weight, and exercise? ?Eat a healthy diet ? ?Eat a diet that includes plenty of vegetables, fruits, low-fat dairy products, and lean protein. ?Do not eat a lot of foods that are high in solid fats, added sugars, or sodium. ?Maintain a healthy weight ?Body mass index (BMI) is used to identify weight problems. It estimates body fat based on height and weight. Your health care provider can help determine your BMI and help you achieve or maintain a healthy weight. ?Get regular exercise ?Get regular exercise. This is one of the most important things you can do for your health. Most adults should: ?Exercise for at least 150 minutes each week. The exercise should increase your heart rate and make you sweat (moderate-intensity exercise). ?Do strengthening exercises at least twice a week. This is in addition to the moderate-intensity exercise. ?Spend less time sitting. Even light physical activity can be beneficial. ?Watch cholesterol and blood lipids ?Have your blood tested for lipids and cholesterol at 62 years of age, then have this test every 5 years. ?Have your cholesterol levels checked more often if: ?Your lipid or cholesterol levels are high. ?You are older than 62 years of age. ?You are at high risk for heart disease. ?What should I know about cancer screening? ?Depending on your health history and family history, you may need to have cancer screening at various ages. This may include screening for: ?Breast cancer. ?Cervical cancer. ?Colorectal cancer. ?Skin cancer. ?Lung cancer. ?What should I know about heart disease, diabetes,  and high blood pressure? ?Blood pressure and heart disease ?High blood pressure causes heart disease and increases the risk of stroke. This is more likely to develop in people who have high blood pressure readings or are overweight. ?Have your blood pressure checked: ?Every 3-5 years if you are 36-50 years of age. ?Every year if you are 23 years old or older. ?Diabetes ?Have regular diabetes screenings. This checks your fasting blood sugar level. Have the screening done: ?Once every three years after age 83 if you are at a normal weight and have a low risk for diabetes. ?More often and at a younger age if you are overweight or have a high risk for diabetes. ?What should I know about preventing infection? ?Hepatitis B ?If you have a higher risk for hepatitis B, you should be screened for this virus. Talk with your health care provider to find out if you are at risk for hepatitis B infection. ?Hepatitis C ?Testing is recommended for: ?Everyone born from [birth year] through 1966. ?Anyone with known risk factors for hepatitis C. ?Sexually transmitted infections (STIs) ?Get screened for STIs, including gonorrhea and chlamydia, if: ?You are sexually active and are younger than 62 years of age. ?You are older than 62 years of age and your health care provider tells you that you are at risk for this type of infection. ?Your sexual activity has changed since you were last screened, and you are at increased risk for chlamydia or gonorrhea. Ask your health care provider if you are at risk. ?Ask your health care provider about whether you are at high risk for HIV. Your health care provider may recommend  a prescription medicine to help prevent HIV infection. If you choose to take medicine to prevent HIV, you should first get tested for HIV. You should then be tested every 3 months for as long as you are taking the medicine. ?Pregnancy ?If you are about to stop having your period (premenopausal) and you may become pregnant, seek  counseling before you get pregnant. ?Take 400 to 800 micrograms (mcg) of folic acid every day if you become pregnant. ?Ask for birth control (contraception) if you want to prevent pregnancy. ?Osteoporosis and menopause ?Osteoporosis is a disease in which the bones lose minerals and strength with aging. This can result in bone fractures. If you are 22 years old or older, or if you are at risk for osteoporosis and fractures, ask your health care provider if you should: ?Be screened for bone loss. ?Take a calcium or vitamin D supplement to lower your risk of fractures. ?Be given hormone replacement therapy (HRT) to treat symptoms of menopause. ?Follow these instructions at home: ?Alcohol use ?Do not drink alcohol if: ?Your health care provider tells you not to drink. ?You are pregnant, may be pregnant, or are planning to become pregnant. ?If you drink alcohol: ?Limit how much you have to: ?0-1 drink a day. ?Know how much alcohol is in your drink. In the [place] one drink equals one 12 oz bottle of beer (355 mL), one 5 oz glass of wine (148 mL), or one 1? oz glass of hard liquor (44 mL). ?Lifestyle ?Do not use any products that contain nicotine or tobacco. These products include cigarettes, chewing tobacco, and vaping devices, such as e-cigarettes. If you need help quitting, ask your health care provider. ?Do not use street drugs. ?Do not share needles. ?Ask your health care provider for help if you need support or information about quitting drugs. ?General instructions ?Schedule regular health, dental, and eye exams. ?Stay current with your vaccines. ?Tell your health care provider if: ?You often feel depressed. ?You have ever been abused or do not feel safe at home. ?Summary ?Adopting a healthy lifestyle and getting preventive care are important in promoting health and wellness. ?Follow your health care provider's instructions about healthy diet, exercising, and getting tested or screened for diseases. ?Follow your  health care provider's instructions on monitoring your cholesterol and blood pressure. ?This information is not intended to replace advice given to you by your health care provider. Make sure you discuss any questions you have with your health care provider. ?Document Revised: 03/10/2021 Document Reviewed: 03/10/2021 ?Elsevier Patient Education ? 2023 [employer] ? ?

## 2022-03-18 LAB — CBC WITH DIFFERENTIAL/PLATELET
Absolute Monocytes: 400 cells/uL (ref 200–950)
Basophils Absolute: 38 cells/uL (ref 0–200)
Basophils Relative: 0.7 %
Eosinophils Absolute: 97 cells/uL (ref 15–500)
Eosinophils Relative: 1.8 %
HCT: 37.3 % (ref 35.0–45.0)
Hemoglobin: 11.9 g/dL (ref 11.7–15.5)
Lymphs Abs: 1258 cells/uL (ref 850–3900)
MCH: 27 pg (ref 27.0–33.0)
MCHC: 31.9 g/dL — ABNORMAL LOW (ref 32.0–36.0)
MCV: 84.8 fL (ref 80.0–100.0)
MPV: 10.3 fL (ref 7.5–12.5)
Monocytes Relative: 7.4 %
Neutro Abs: 3607 cells/uL (ref 1500–7800)
Neutrophils Relative %: 66.8 %
Platelets: 329 10*3/uL (ref 140–400)
RBC: 4.4 10*6/uL (ref 3.80–5.10)
RDW: 13.6 % (ref 11.0–15.0)
Total Lymphocyte: 23.3 %
WBC: 5.4 10*3/uL (ref 3.8–10.8)

## 2022-03-18 LAB — TSH: TSH: 2.3 mIU/L (ref 0.40–4.50)

## 2022-03-18 LAB — COMPLETE METABOLIC PANEL WITH GFR
AG Ratio: 1.6 (calc) (ref 1.0–2.5)
ALT: 16 U/L (ref 6–29)
AST: 17 U/L (ref 10–35)
Albumin: 4.2 g/dL (ref 3.6–5.1)
Alkaline phosphatase (APISO): 96 U/L (ref 37–153)
BUN: 11 mg/dL (ref 7–25)
CO2: 24 mmol/L (ref 20–32)
Calcium: 9.5 mg/dL (ref 8.6–10.4)
Chloride: 109 mmol/L (ref 98–110)
Creat: 0.84 mg/dL (ref 0.50–1.05)
Globulin: 2.6 g/dL (calc) (ref 1.9–3.7)
Glucose, Bld: 92 mg/dL (ref 65–99)
Potassium: 4.5 mmol/L (ref 3.5–5.3)
Sodium: 141 mmol/L (ref 135–146)
Total Bilirubin: 0.4 mg/dL (ref 0.2–1.2)
Total Protein: 6.8 g/dL (ref 6.1–8.1)
eGFR: 79 mL/min/{1.73_m2} (ref >=60)

## 2022-03-18 LAB — CYTOLOGY - PAP
Comment: NEGATIVE
Diagnosis: NEGATIVE
High risk HPV: NEGATIVE

## 2022-03-18 LAB — LIPID PANEL W/REFLEX DIRECT LDL
Cholesterol: 250 mg/dL — ABNORMAL HIGH (ref <200)
HDL: 75 mg/dL (ref >=50)
LDL Cholesterol (Calc): 150 mg/dL (calc) — ABNORMAL HIGH
Non-HDL Cholesterol (Calc): 175 mg/dL (calc) — ABNORMAL HIGH (ref <130)
Total CHOL/HDL Ratio: 3.3 (calc) (ref <5.0)
Triglycerides: 129 mg/dL (ref <150)

## 2022-03-18 LAB — HEMOGLOBIN A1C
Hgb A1c MFr Bld: 5.8 % of total Hgb — ABNORMAL HIGH (ref <5.7)
Mean Plasma Glucose: 120 mg/dL
eAG (mmol/L): 6.6 mmol/L

## 2022-03-18 LAB — VITAMIN D 25 HYDROXY (VIT D DEFICIENCY, FRACTURES): Vit D, 25-Hydroxy: 22 ng/mL — ABNORMAL LOW (ref 30–100)

## 2022-03-18 NOTE — Progress Notes (Signed)
A1C stable from 1 year ago. Continues to be in pre-diabetes range. Continue to avoid excessive sugars and carbs. Exercise daily.  ?Thyroid looks great.  ?Kidney and liver look great.  ?Vitamin d is low. Need to take 2000 units D3 daily, OTC.  ?HDL, good cholesterol, looks GREAT! ?LDL, bad cholesterol, not optimal but better than last year.  ? ?Marland Kitchen.The 10-year ASCVD risk score (Arnett DK, et al., 2019) is: 4% ?  Values used to calculate the score: ?    Age: 61 years ?    Sex: Female ?    Is Non-Hispanic African American: No ?    Diabetic: No ?    Tobacco smoker: No ?    Systolic Blood Pressure: 137 mmHg ?    Is BP treated: No ?    HDL Cholesterol: 75 mg/dL ?    Total Cholesterol: 250 mg/dL ? ?Overall CV 10 year risk is 4 percent and under 7.5 percent to suggest statin therapy. Primary prevention goal is LDL under 100. You are 89. It is reasonable to start a statin now to attempt better CV risk reduction. Thoughts?

## 2022-03-18 NOTE — Progress Notes (Signed)
No abnormal cells and negative for HPV. You could repeat pap right before 65 and then you do not need anymore pap smears!

## 2022-03-24 ENCOUNTER — Ambulatory Visit (INDEPENDENT_AMBULATORY_CARE_PROVIDER_SITE_OTHER): Payer: BC Managed Care – PPO

## 2022-03-24 DIAGNOSIS — R1900 Intra-abdominal and pelvic swelling, mass and lump, unspecified site: Secondary | ICD-10-CM | POA: Diagnosis not present

## 2022-03-24 DIAGNOSIS — R102 Pelvic and perineal pain: Secondary | ICD-10-CM | POA: Diagnosis not present

## 2022-03-25 NOTE — Progress Notes (Signed)
Saria, Haran is out of the office today so wanted to make sure that we followed up on your ultrasound report.  Everything looks very normal and reassuring which is great.  No worrisome findings.  The next option would be to refer you to OB/GYN if you are still having pain or discomfort.

## 2022-04-01 ENCOUNTER — Encounter: Payer: Self-pay | Admitting: Physician Assistant

## 2022-04-01 ENCOUNTER — Ambulatory Visit (INDEPENDENT_AMBULATORY_CARE_PROVIDER_SITE_OTHER): Payer: BC Managed Care – PPO

## 2022-04-01 DIAGNOSIS — M8580 Other specified disorders of bone density and structure, unspecified site: Secondary | ICD-10-CM | POA: Diagnosis not present

## 2022-04-01 DIAGNOSIS — Z780 Asymptomatic menopausal state: Secondary | ICD-10-CM | POA: Diagnosis not present

## 2022-04-01 DIAGNOSIS — Z1231 Encounter for screening mammogram for malignant neoplasm of breast: Secondary | ICD-10-CM

## 2022-04-01 DIAGNOSIS — M810 Age-related osteoporosis without current pathological fracture: Secondary | ICD-10-CM | POA: Diagnosis not present

## 2022-04-01 NOTE — Progress Notes (Signed)
Your bone density is just in the osteoporosis range. You need to maximize your vitamin D and calcium as well as consider medications that can improve your bone density like fosamax. Would you like for me to call you and discuss medication options?

## 2022-04-02 NOTE — Progress Notes (Signed)
Normal mammogram. Follow up in 1 year.

## 2022-08-02 ENCOUNTER — Ambulatory Visit
Admission: RE | Admit: 2022-08-02 | Discharge: 2022-08-02 | Disposition: A | Discharge status: Home / Self Care | Payer: BC Managed Care – PPO | Source: Ambulatory Visit | Attending: Family Medicine | Admitting: Family Medicine

## 2022-08-02 ENCOUNTER — Ambulatory Visit (INDEPENDENT_AMBULATORY_CARE_PROVIDER_SITE_OTHER): Payer: BC Managed Care – PPO

## 2022-08-02 VITALS — BP 164/79 | HR 91 | Temp 99.9°F | Resp 18 | Ht 60.0 in | Wt 170.0 lb

## 2022-08-02 DIAGNOSIS — S4992XA Unspecified injury of left shoulder and upper arm, initial encounter: Secondary | ICD-10-CM | POA: Diagnosis not present

## 2022-08-02 DIAGNOSIS — M25512 Pain in left shoulder: Secondary | ICD-10-CM | POA: Diagnosis not present

## 2022-08-02 DIAGNOSIS — M47814 Spondylosis without myelopathy or radiculopathy, thoracic region: Secondary | ICD-10-CM | POA: Diagnosis not present

## 2022-08-02 DIAGNOSIS — W19XXXA Unspecified fall, initial encounter: Secondary | ICD-10-CM | POA: Diagnosis not present

## 2022-08-02 NOTE — ED Triage Notes (Signed)
Patient states that she tripped over her recliner foot rest last night and landed on her left shoulder.   Patient having a hard time raising her left arm.  Pain radiates from left shoulder down to elbow.  Patient has taken ES Tylenol.

## 2022-08-02 NOTE — Discharge Instructions (Signed)
Use ice for 20 minutes every couple of hours to reduce pain and swelling Limit use of arm while painful Take Tylenol, ibuprofen, or naproxen as needed for pain See your PCP or Dr Dianah Field if you fail to improve

## 2022-08-02 NOTE — ED Provider Notes (Addendum)
[facility]    CSN: [id number] Arrival date & time: 08/02/22  1136      History   Chief Complaint Chief Complaint  Patient presents with   Arm Injury    Entered by patient   Appointment    HPI Renee Burns is a 61 y.o. female.   HPI Patient tripped in her home last night and fell over towards her left side.  She is here with pain in her shoulder and inability to raise her arm. Patient has not had any fractures before but her bone density does show osteoporosis when tested in May 2023.  Past Medical History:  Diagnosis Date   Anxiety    Asthma    Hyperlipidemia    Ovarian cyst    Pneumonia    Ulcerative colitis ([place])     Patient Active Problem List   Diagnosis Date Noted   Osteoporosis 04/01/2022   Prediabetes 03/04/2021   Class 1 obesity due to excess calories without serious comorbidity with body mass index (BMI) of 32.0 to 32.9 in adult 11/06/2017   Anxiety 11/06/2017   Macular degeneration, right eye 11/05/2017   H/O colostomy 05/22/2016   Abnormal weight gain 12/07/2014   Hypercholesteremia 06/12/2014   Asthma, cough variant 06/12/2014   Affective disorder ([place]) 06/12/2014   Depression, recurrent ([place]) 06/12/2014   H/O chronic ulcerative colitis 01/25/2013    Past Surgical History:  Procedure Laterality Date   CESAREAN SECTION     CHOLECYSTECTOMY     COLOSTOMY      OB History     Gravida  2   Para  2   Term  1   Preterm  1   AB      Living  2      SAB      IAB      Ectopic      Multiple      Live Births               Home Medications    Prior to Admission medications   Medication Sig Start Date End Date Taking? Authorizing Provider  albuterol (VENTOLIN HFA) 108 (90 Base) MCG/ACT inhaler Inhale 2 puffs into the lungs every 6 (six) hours as needed for wheezing or shortness of breath. 03/17/22  Yes Breeback, Jade L, PA-C  buPROPion (WELLBUTRIN SR) 150 MG 12 hr tablet Take 1 tablet (150 mg total) by mouth 2  (two) times daily. 03/17/22  Yes Breeback, Jade L, PA-C  escitalopram (LEXAPRO) 10 MG tablet Take 1 tablet (10 mg total) by mouth daily. 03/17/22  Yes Breeback, Jade L, PA-C  fluticasone (FLONASE) 50 MCG/ACT nasal spray Place 2 sprays into both nostrils daily. 02/21/20  Yes Breeback, Jade L, PA-C  fluticasone (FLOVENT HFA) 44 MCG/ACT inhaler Inhale 2 puffs into the lungs 2 (two) times daily. 03/17/22  Yes Breeback, Jade L, PA-C  Multiple Minerals-Vitamins (CALCIUM CITRATE +) TABS Take by mouth daily.   Yes [provider]  Multiple Vitamins-Minerals (WOMENS MULTIVITAMIN PLUS) TABS Take 1 tablet by mouth.   Yes [provider]    Family History Family History  Problem Relation Age of Onset   Heart disease Father    Cancer Father        bladder CA   Diabetes Mother     Social History Social History   Tobacco Use   Smoking status: Never   Smokeless tobacco: Never  Substance Use Topics   Alcohol use: No   Drug use:  No     Allergies   Augmentin [amoxicillin-pot clavulanate] and Loracarbef   Review of Systems Review of Systems See HPI  Physical Exam Triage Vital Signs ED Triage Vitals  Enc Vitals Group     BP 08/02/22 1145 (!) 164/79     Pulse Rate 08/02/22 1145 91     Resp 08/02/22 1145 18     Temp 08/02/22 1145 99.9 F (37.7 C)     Temp Source 08/02/22 1145 Oral     SpO2 08/02/22 1145 97 %     Weight 08/02/22 1147 170 lb (77.1 kg)     Height 08/02/22 1147 5' (1.524 m)     Head Circumference --      Peak Flow --      Pain Score 08/02/22 1147 8     Pain Loc --      Pain Edu? --      Excl. in GC? --    No data found.  Updated Vital Signs BP (!) 164/79 (BP Location: Right Arm)   Pulse 91   Temp 99.9 F (37.7 C) (Oral)   Resp 18   Ht 5' (1.524 m)   Wt 77.1 kg   LMP 08/12/2012   SpO2 97%   BMI 33.20 kg/m      Physical Exam Constitutional:      General: She is not in acute distress.    Appearance: She is well-developed.  HENT:     Head:  Normocephalic and atraumatic.  Eyes:     Conjunctiva/sclera: Conjunctivae normal.     Pupils: Pupils are equal, round, and reactive to light.  Cardiovascular:     Rate and Rhythm: Normal rate.  Pulmonary:     Effort: Pulmonary effort is normal. No respiratory distress.  Abdominal:     General: There is no distension.     Palpations: Abdomen is soft.  Musculoskeletal:        General: Tenderness and signs of injury present. No swelling. Normal range of motion.     Cervical back: Normal range of motion.     Comments: Tender proximal humerus.  No tenderness over the scapula or AC area.  No tenderness over the distal humerus or elbow.  Pain with abduction of arm over 10 degrees  Skin:    General: Skin is warm and dry.  Neurological:     Mental Status: She is alert.      UC Treatments / Results  Labs (all labs ordered are listed, but only abnormal results are displayed) Labs Reviewed - No data to display  EKG   Radiology DG Shoulder Left  Result Date: 08/02/2022 CLINICAL DATA:  Left shoulder injury. Status post fall 1 day ago. Landing on left shoulder. EXAM: LEFT SHOULDER - 2+ VIEW COMPARISON:  None Available. FINDINGS: Decreased bone mineralization. Mild inferior acromioclavicular joint space narrowing. No acute fracture is seen. No dislocation. Mild multilevel degenerative disc changes of the thoracic spine IMPRESSION: 1. No acute fracture. 2. Mild acromioclavicular osteoarthritis. Electronically Signed   By: Renee Burns M.D.   On: 08/02/2022 12:20    Procedures Procedures (including critical care time)  Medications Ordered in UC Medications - No data to display  Initial Impression / Assessment and Plan / UC Course  I have reviewed the triage vital signs and the nursing notes.  Pertinent labs & imaging results that were available during my care of the patient were reviewed by me and considered in my medical decision making (see chart for details).  Final Clinical  Impressions(s) / UC Diagnoses   Final diagnoses:  Acute pain of left shoulder  Fall, initial encounter     Discharge Instructions      Use ice for 20 minutes every couple of hours to reduce pain and swelling Limit use of arm while painful Take Tylenol, ibuprofen, or naproxen as needed for pain See your PCP or Renee Burns if you fail to improve     ED Prescriptions   None    PDMP not reviewed this encounter.   Eustace Moore, MD 08/02/22 1259    Eustace Moore, MD 08/02/22 1300

## 2022-08-03 ENCOUNTER — Telehealth: Payer: Self-pay | Admitting: Emergency Medicine

## 2022-08-03 NOTE — Telephone Encounter (Signed)
Spoke with patient states that she is doing somewhat better today.  Patient is able to move her arm more today.  Will follow up as needed.

## 2022-12-21 ENCOUNTER — Other Ambulatory Visit: Payer: Self-pay | Admitting: Physician Assistant

## 2022-12-21 DIAGNOSIS — J45991 Cough variant asthma: Secondary | ICD-10-CM

## 2023-02-23 ENCOUNTER — Ambulatory Visit (INDEPENDENT_AMBULATORY_CARE_PROVIDER_SITE_OTHER): Payer: BC Managed Care – PPO | Admitting: Physician Assistant

## 2023-02-23 ENCOUNTER — Other Ambulatory Visit: Payer: Self-pay | Admitting: Physician Assistant

## 2023-02-23 ENCOUNTER — Encounter: Payer: Self-pay | Admitting: Physician Assistant

## 2023-02-23 VITALS — BP 137/81 | HR 72 | Ht 60.0 in | Wt 176.0 lb

## 2023-02-23 DIAGNOSIS — E6609 Other obesity due to excess calories: Secondary | ICD-10-CM | POA: Diagnosis not present

## 2023-02-23 DIAGNOSIS — Z6834 Body mass index (BMI) 34.0-34.9, adult: Secondary | ICD-10-CM | POA: Diagnosis not present

## 2023-02-23 DIAGNOSIS — E7800 Pure hypercholesterolemia, unspecified: Secondary | ICD-10-CM | POA: Diagnosis not present

## 2023-02-23 DIAGNOSIS — R7303 Prediabetes: Secondary | ICD-10-CM | POA: Diagnosis not present

## 2023-02-23 LAB — POCT GLYCOSYLATED HEMOGLOBIN (HGB A1C): Hemoglobin A1C: 5.9 % — AB (ref 4.0–5.6)

## 2023-02-23 MED ORDER — ZEPBOUND 7.5 MG/0.5ML ~~LOC~~ SOAJ: 7.5000 mg | SUBCUTANEOUS | 0 refills | Status: DC

## 2023-02-23 MED ORDER — ZEPBOUND 5 MG/0.5ML ~~LOC~~ SOAJ: 5.0000 mg | SUBCUTANEOUS | 0 refills | Status: DC

## 2023-02-23 MED ORDER — ZEPBOUND 2.5 MG/0.5ML ~~LOC~~ SOAJ: 2.5000 mg | SUBCUTANEOUS | 0 refills | Status: DC

## 2023-02-23 NOTE — Progress Notes (Signed)
Established Patient Office Visit  Subjective   Patient ID: Renee Burns, female    DOB: January 06, 1961  Age: 62 y.o. MRN: [id number]  Chief Complaint  Patient presents with   Follow-up    HPI Pt is a 62 yo obese female who is pre-diabetic who wants to talk about weight loss. She has gained 7lbs since last visit in October. She is trying to stay active but noticed more and more joint pain from weight. She has tried phentermine in the past and did work but gained weight back. She is on wellbutrin SR. She has a lot of food hunger and cravings.   .. Active Ambulatory Problems    Diagnosis Date Noted   Hypercholesteremia 06/12/2014   H/O chronic ulcerative colitis 01/25/2013   Asthma, cough variant 06/12/2014   Affective disorder 06/12/2014   Depression, recurrent 06/12/2014   Abnormal weight gain 12/07/2014   H/O colostomy 05/22/2016   Macular degeneration, right eye 11/05/2017   Class 1 obesity due to excess calories without serious comorbidity with body mass index (BMI) of 34.0 to 34.9 in adult 11/06/2017   Anxiety 11/06/2017   Prediabetes 03/04/2021   Osteoporosis 04/01/2022   Resolved Ambulatory Problems    Diagnosis Date Noted   Obesity 12/07/2014   Overweight (BMI 25.0-29.9) 05/22/2016   Elevated fasting glucose 02/22/2020   Pelvic fullness in female 03/17/2022   Osteopenia 03/17/2022   Past Medical History:  Diagnosis Date   Asthma    Hyperlipidemia    Ovarian cyst    Pneumonia    Ulcerative colitis      ROS   See HPI.  Objective:     BP 137/81   Pulse 72   Ht 5' (1.524 m)   Wt 176 lb (79.8 kg)   LMP 08/12/2012   SpO2 99%   BMI 34.37 kg/m  BP Readings from Last 3 Encounters:  02/23/23 137/81  08/02/22 (!) 164/79  03/17/22 137/81   Wt Readings from Last 3 Encounters:  02/23/23 176 lb (79.8 kg)  08/02/22 170 lb (77.1 kg)  03/17/22 167 lb (75.8 kg)    .Marland Kitchen Results for orders placed or performed in visit on 02/23/23  POCT HgB A1C  Result Value  Ref Range   Hemoglobin A1C 5.9 (A) 4.0 - 5.6 %   HbA1c POC (<> result, manual entry)     HbA1c, POC (prediabetic range)     HbA1c, POC (controlled diabetic range)       Physical Exam Constitutional:      Appearance: Normal appearance. She is obese.  HENT:     Head: Normocephalic.  Cardiovascular:     Rate and Rhythm: Normal rate.  Pulmonary:     Effort: Pulmonary effort is normal.  Neurological:     General: No focal deficit present.     Mental Status: She is alert and oriented to person, place, and time.  Psychiatric:        Mood and Affect: Mood normal.                          The 10-year ASCVD risk score (Arnett DK, et al., 2019) is: 4.5%    Assessment & Plan:  Marland KitchenMarland KitchenXenia was seen today for follow-up.  Diagnoses and all orders for this visit:  Class 1 obesity due to excess calories without serious comorbidity with body mass index (BMI) of 34.0 to 34.9 in adult -     tirzepatide (ZEPBOUND) 2.5 MG/0.5ML Pen; Inject  2.5 mg into the skin once a week. -     tirzepatide (ZEPBOUND) 5 MG/0.5ML Pen; Inject 5 mg into the skin once a week. -     tirzepatide (ZEPBOUND) 7.5 MG/0.5ML Pen; Inject 7.5 mg into the skin once a week.  Prediabetes -     tirzepatide (ZEPBOUND) 2.5 MG/0.5ML Pen; Inject 2.5 mg into the skin once a week. -     tirzepatide (ZEPBOUND) 5 MG/0.5ML Pen; Inject 5 mg into the skin once a week. -     tirzepatide (ZEPBOUND) 7.5 MG/0.5ML Pen; Inject 7.5 mg into the skin once a week. -     POCT HgB A1C  Hypercholesteremia -     tirzepatide (ZEPBOUND) 2.5 MG/0.5ML Pen; Inject 2.5 mg into the skin once a week. -     tirzepatide (ZEPBOUND) 5 MG/0.5ML Pen; Inject 5 mg into the skin once a week. -     tirzepatide (ZEPBOUND) 7.5 MG/0.5ML Pen; Inject 7.5 mg into the skin once a week.  A1C continues in pre-diabetes and has worsened Discussed diet and exercise Add zepbound Coupon given Discussed SE and how to titrate up Follow up in 3 months   Tandy Gaw,  PA-C

## 2023-03-08 ENCOUNTER — Other Ambulatory Visit: Payer: Self-pay | Admitting: Physician Assistant

## 2023-03-08 DIAGNOSIS — Z1231 Encounter for screening mammogram for malignant neoplasm of breast: Secondary | ICD-10-CM

## 2023-04-20 ENCOUNTER — Ambulatory Visit (INDEPENDENT_AMBULATORY_CARE_PROVIDER_SITE_OTHER): Payer: BC Managed Care – PPO

## 2023-04-20 DIAGNOSIS — Z1231 Encounter for screening mammogram for malignant neoplasm of breast: Secondary | ICD-10-CM

## 2023-04-21 ENCOUNTER — Ambulatory Visit: Payer: BC Managed Care – PPO

## 2023-04-21 NOTE — Progress Notes (Signed)
Normal mammogram. Follow up in 1 year.

## 2023-05-19 ENCOUNTER — Other Ambulatory Visit: Payer: Self-pay | Admitting: Physician Assistant

## 2023-05-19 DIAGNOSIS — F419 Anxiety disorder, unspecified: Secondary | ICD-10-CM

## 2023-05-19 DIAGNOSIS — F339 Major depressive disorder, recurrent, unspecified: Secondary | ICD-10-CM

## 2023-05-20 ENCOUNTER — Other Ambulatory Visit: Payer: Self-pay | Admitting: Physician Assistant

## 2023-05-20 DIAGNOSIS — F339 Major depressive disorder, recurrent, unspecified: Secondary | ICD-10-CM

## 2023-05-20 DIAGNOSIS — F419 Anxiety disorder, unspecified: Secondary | ICD-10-CM

## 2023-05-31 ENCOUNTER — Ambulatory Visit: Payer: BC Managed Care – PPO | Admitting: Physician Assistant

## 2023-06-01 ENCOUNTER — Encounter: Payer: Self-pay | Admitting: Physician Assistant

## 2023-06-01 ENCOUNTER — Ambulatory Visit (INDEPENDENT_AMBULATORY_CARE_PROVIDER_SITE_OTHER): Payer: BC Managed Care – PPO | Admitting: Physician Assistant

## 2023-06-01 VITALS — BP 124/78 | HR 98 | Ht 60.0 in | Wt 158.0 lb

## 2023-06-01 DIAGNOSIS — Z6830 Body mass index (BMI) 30.0-30.9, adult: Secondary | ICD-10-CM | POA: Diagnosis not present

## 2023-06-01 DIAGNOSIS — E6609 Other obesity due to excess calories: Secondary | ICD-10-CM | POA: Diagnosis not present

## 2023-06-01 MED ORDER — TIRZEPATIDE 5 MG/0.5ML ~~LOC~~ SOAJ: 5.0000 mg | SUBCUTANEOUS | 1 refills | Status: DC

## 2023-06-01 NOTE — Progress Notes (Signed)
Established Patient Office Visit  Subjective   Patient ID: Renee Burns, female    DOB: Mar 14, 1961  Age: 62 y.o. MRN: [id number]  Chief Complaint  Patient presents with   follow up    HPI Patient is a 62 year old obese female who presents to the clinic to follow-up on 7 pounds and weight loss.  She is doing great with stepdown.  She has been on the 2.5 mg dose for only 1 month.  Due to stock issues she was not able to get the next 5 mg dose.  In the first month she lost a significant amount of weight and has been able to continue to lose for the next 2 months.  She is approaching 18 pounds of weight loss in 3 months.  She had no side effects. She feels great. She is exercising regularly and eating less.   .. Active Ambulatory Problems    Diagnosis Date Noted   Hypercholesteremia 06/12/2014   H/O chronic ulcerative colitis 01/25/2013   Asthma, cough variant 06/12/2014   Affective disorder ([facility]) 06/12/2014   Depression, recurrent ([facility]) 06/12/2014   Abnormal weight gain 12/07/2014   H/O colostomy 05/22/2016   Macular degeneration, right eye 11/05/2017   Class 1 obesity due to excess calories without serious comorbidity with body mass index (BMI) of 34.0 to 34.9 in adult 63/03/2018   Anxiety 11/06/2017   Prediabetes 03/04/2021   Osteoporosis 04/01/2022   Resolved Ambulatory Problems    Diagnosis Date Noted   Obesity 12/07/2014   Overweight (BMI 25.0-29.9) 05/22/2016   Elevated fasting glucose 02/22/2020   Pelvic fullness in female 03/17/2022   Osteopenia 03/17/2022   Past Medical History:  Diagnosis Date   Asthma    Hyperlipidemia    Ovarian cyst    Pneumonia    Ulcerative colitis ([facility])      ROS    Objective:     BP 124/78   Pulse 98   Ht 5' (1.524 m)   Wt 158 lb 0.6 oz (71.7 kg)   LMP 08/12/2012   SpO2 98%   BMI 30.87 kg/m  BP Readings from Last 3 Encounters:  06/01/23 124/78  02/23/23 137/81  08/02/22 (!) 164/79   Wt Readings from Last 3  Encounters:  06/01/23 158 lb 0.6 oz (71.7 kg)  02/23/23 176 lb (79.8 kg)  08/02/22 170 lb (77.1 kg)      Physical Exam Constitutional:      Appearance: Normal appearance.  Cardiovascular:     Rate and Rhythm: Normal rate and regular rhythm.     Pulses: Normal pulses.  Pulmonary:     Effort: Pulmonary effort is normal.     Breath sounds: Normal breath sounds.  Musculoskeletal:     Cervical back: Normal range of motion and neck supple.     Right lower leg: No edema.     Left lower leg: No edema.  Neurological:     General: No focal deficit present.     Mental Status: She is alert and oriented to person, place, and time.  Psychiatric:        Mood and Affect: Mood normal.      The 10-year ASCVD risk score (Arnett DK, et al., 2019) is: 4.2%    Assessment & Plan:  Marland KitchenMarland KitchenLoveah was seen today for follow up.  Diagnoses and all orders for this visit:  Class 1 obesity due to excess calories without serious comorbidity with body mass index (BMI) of 30.0 to 30.9 in adult -  tirzepatide [facility]) 5 MG/0.5ML Pen; Inject 5 mg into the skin once a week.   Increase to 5mg  weekly sent refills.  Follow up in 6 months.     Return in about 6 months (around 12/02/2023).    Renee Gaw, PA-C

## 2023-07-26 ENCOUNTER — Encounter: Payer: Self-pay | Admitting: Physician Assistant

## 2023-07-26 ENCOUNTER — Ambulatory Visit (INDEPENDENT_AMBULATORY_CARE_PROVIDER_SITE_OTHER): Payer: BC Managed Care – PPO | Admitting: Physician Assistant

## 2023-07-26 VITALS — BP 114/74 | HR 81 | Ht 60.0 in | Wt 138.0 lb

## 2023-07-26 DIAGNOSIS — Z0000 Encounter for general adult medical examination without abnormal findings: Secondary | ICD-10-CM

## 2023-07-26 DIAGNOSIS — R7303 Prediabetes: Secondary | ICD-10-CM | POA: Diagnosis not present

## 2023-07-26 DIAGNOSIS — Z23 Encounter for immunization: Secondary | ICD-10-CM

## 2023-07-26 DIAGNOSIS — F419 Anxiety disorder, unspecified: Secondary | ICD-10-CM

## 2023-07-26 DIAGNOSIS — E663 Overweight: Secondary | ICD-10-CM

## 2023-07-26 DIAGNOSIS — Z131 Encounter for screening for diabetes mellitus: Secondary | ICD-10-CM

## 2023-07-26 DIAGNOSIS — E7800 Pure hypercholesterolemia, unspecified: Secondary | ICD-10-CM

## 2023-07-26 DIAGNOSIS — F339 Major depressive disorder, recurrent, unspecified: Secondary | ICD-10-CM | POA: Diagnosis not present

## 2023-07-26 DIAGNOSIS — Z780 Asymptomatic menopausal state: Secondary | ICD-10-CM | POA: Insufficient documentation

## 2023-07-26 DIAGNOSIS — Z8639 Personal history of other endocrine, nutritional and metabolic disease: Secondary | ICD-10-CM | POA: Insufficient documentation

## 2023-07-26 MED ORDER — ZEPBOUND 7.5 MG/0.5ML ~~LOC~~ SOAJ: 7.5000 mg | SUBCUTANEOUS | 1 refills | Status: DC

## 2023-07-26 MED ORDER — ESCITALOPRAM OXALATE 10 MG PO TABS
10.0000 mg | ORAL_TABLET | Freq: Every day | ORAL | 1 refills | Status: DC
Start: 2023-07-26 — End: 2023-12-06

## 2023-07-26 NOTE — Patient Instructions (Signed)

## 2023-07-26 NOTE — Progress Notes (Signed)
Complete physical exam  Patient: Renee Burns   DOB: [birth year]   62 y.o. Female  MRN: [id number]  Subjective:    Chief Complaint  Patient presents with   Annual Exam    Renee Burns is a 62 y.o. female who presents today for a complete physical exam. She reports consuming a general diet. The patient does not participate in regular exercise at present. But does report that she works an active job and normally averages 10,000 steps daily. Patient does also state that she often engages in physically active hobbies such as hiking.  She generally feels well. She reports sleeping well. She does not have additional problems to discuss today.    Most recent fall risk assessment:    07/26/2023    9:46 AM  Fall Risk   Falls in the past year? 0  Number falls in past yr: 0  Injury with Fall? 0     Most recent depression screenings:    07/26/2023    9:46 AM 06/01/2023   11:12 AM  PHQ 2/9 Scores  PHQ - 2 Score 0 2  PHQ- 9 Score  4        Patient Care Team: Nolene Ebbs as PCP - General (Family Medicine)   Outpatient Medications Prior to Visit  Medication Sig   albuterol (VENTOLIN HFA) 108 (90 Base) MCG/ACT inhaler TAKE 2 PUFFS BY MOUTH EVERY 6 HOURS AS NEEDED FOR WHEEZE OR SHORTNESS OF BREATH   buPROPion (WELLBUTRIN SR) 150 MG 12 hr tablet TAKE 1 TABLET BY MOUTH TWICE A DAY   fluticasone (FLONASE) 50 MCG/ACT nasal spray Place 2 sprays into both nostrils daily.   fluticasone (FLOVENT HFA) 44 MCG/ACT inhaler Inhale 2 puffs into the lungs 2 (two) times daily.   Multiple Minerals-Vitamins (CALCIUM CITRATE +) TABS Take by mouth daily.   Multiple Vitamins-Minerals (WOMENS MULTIVITAMIN PLUS) TABS Take 1 tablet by mouth.   [DISCONTINUED] escitalopram (LEXAPRO) 10 MG tablet TAKE 1 TABLET BY MOUTH EVERY DAY   [DISCONTINUED] tirzepatide (MOUNJARO) 5 MG/0.5ML Pen Inject 5 mg into the skin once a week.   No facility-administered medications prior to visit.    Review of  Systems  All other systems reviewed and are negative.         Objective:     BP 114/74   Pulse 81   Ht 5' (1.524 m)   Wt 138 lb (62.6 kg)   LMP 08/12/2012   SpO2 99%   BMI 26.95 kg/m  BP Readings from Last 3 Encounters:  07/26/23 114/74  06/01/23 124/78  02/23/23 137/81   Wt Readings from Last 3 Encounters:  07/26/23 138 lb (62.6 kg)  06/01/23 158 lb 0.6 oz (71.7 kg)  02/23/23 176 lb (79.8 kg)      Physical Exam  BP 114/74   Pulse 81   Ht 5' (1.524 m)   Wt 138 lb (62.6 kg)   LMP 08/12/2012   SpO2 99%   BMI 26.95 kg/m   General Appearance:    Alert, cooperative, no distress, appears stated age  Head:    Normocephalic, without obvious abnormality, atraumatic  Eyes:    PERRL, conjunctiva/corneas clear, EOM's intact, fundi    benign, both eyes  Ears:    Normal TM's and external ear canals, both ears  Nose:   Nares normal, septum midline, mucosa normal, no drainage    or sinus tenderness  Throat:   Lips, mucosa, and tongue normal; teeth and gums normal  Neck:  Supple, symmetrical, trachea midline, no adenopathy;    thyroid:  no enlargement/tenderness/nodules; no carotid   bruit or JVD  Back:     Symmetric, no curvature, ROM normal, no CVA tenderness  Lungs:     Clear to auscultation bilaterally, respirations unlabored  Chest Wall:    No tenderness or deformity   Heart:    Regular rate and rhythm, S1 and S2 normal, no murmur, rub   or gallop     Abdomen:     Soft, non-tender, bowel sounds active all four quadrants,    no masses, no organomegaly        Extremities:   Extremities normal, atraumatic, no cyanosis or edema  Pulses:   2+ and symmetric all extremities  Skin:   Skin color, texture, turgor normal, no rashes or lesions  Lymph nodes:   Cervical, supraclavicular, and axillary nodes normal  Neurologic:   CNII-XII intact, normal strength, sensation and reflexes    throughout      Assessment & Plan:    Routine Health Maintenance and Physical  Exam  Immunization History  Administered Date(s) Administered   Influenza Split 08/21/2010   Influenza, Seasonal, Injecte, Preservative Fre 10/10/2013, 07/26/2023   Influenza,inj,Quad PF,6+ Mos 11/05/2017   Influenza-Unspecified 10/12/2011, 07/29/2012   PFIZER Comirnaty(Gray Top)Covid-19 Tri-Sucrose Vaccine 11/15/2020, 12/06/2020   Pneumococcal Polysaccharide-23 08/14/2009   Td 08/11/2004   Tdap 07/26/2023   Zoster Recombinant(Shingrix) 12/01/2018, 02/23/2019    Health Maintenance  Topic Date Due   HIV Screening  Never done   COVID-19 Vaccine (3 - 2023-24 season) 08/11/2023 (Originally 07/04/2023)   MAMMOGRAM  04/19/2025   Cervical Cancer Screening (HPV/Pap Cotest)  03/18/2027   DTaP/Tdap/Td (3 - Td or Tdap) 07/25/2033   INFLUENZA VACCINE  Completed   Hepatitis C Screening  Completed   Zoster Vaccines- Shingrix  Completed   HPV VACCINES  Aged Out    Discussed health benefits of physical activity, and encouraged her to engage in regular exercise appropriate for her age and condition.  .. Discussed 150 minutes of exercise a week.  Encouraged vitamin D 1000 units and Calcium 1300mg  or 4 servings of dairy a day.   Fasting labs ordered today Vitals look great! Keeping Zepbound at 7.5 mg and encouraged continued diet and exercise modifications Total of 37 lbs. Weight loss Encouraged vitamin D and calcium supplementation for bone health Mammogram and pap smear UTD Colonoscopy not indicated Shingles UTD DTaP and flu administered today Covid booster declined   Return in about 6 months (around 01/23/2024).     Tandy Gaw, PA-C

## 2023-07-27 LAB — CMP14+EGFR
ALT: 11 IU/L (ref 0–32)
AST: 17 IU/L (ref 0–40)
Albumin: 4.6 g/dL (ref 3.9–4.9)
Alkaline Phosphatase: 75 IU/L (ref 44–121)
BUN/Creatinine Ratio: 13 (ref 12–28)
BUN: 10 mg/dL (ref 8–27)
Bilirubin Total: 0.5 mg/dL (ref 0.0–1.2)
CO2: 22 mmol/L (ref 20–29)
Calcium: 9.5 mg/dL (ref 8.7–10.3)
Chloride: 105 mmol/L (ref 96–106)
Creatinine, Ser: 0.8 mg/dL (ref 0.57–1.00)
Globulin, Total: 2.2 g/dL (ref 1.5–4.5)
Glucose: 78 mg/dL (ref 70–99)
Potassium: 4.6 mmol/L (ref 3.5–5.2)
Sodium: 140 mmol/L (ref 134–144)
Total Protein: 6.8 g/dL (ref 6.0–8.5)
eGFR: 83 mL/min/{1.73_m2} (ref >59)

## 2023-07-27 LAB — CBC WITH DIFFERENTIAL/PLATELET
Basophils Absolute: 0 10*3/uL (ref 0.0–0.2)
Basos: 1 %
EOS (ABSOLUTE): 0.1 10*3/uL (ref 0.0–0.4)
Eos: 2 %
Hematocrit: 39.4 % (ref 34.0–46.6)
Hemoglobin: 12 g/dL (ref 11.1–15.9)
Immature Grans (Abs): 0 10*3/uL (ref 0.0–0.1)
Immature Granulocytes: 0 %
Lymphocytes Absolute: 1.1 10*3/uL (ref 0.7–3.1)
Lymphs: 26 %
MCH: 25.4 pg — ABNORMAL LOW (ref 26.6–33.0)
MCHC: 30.5 g/dL — ABNORMAL LOW (ref 31.5–35.7)
MCV: 84 fL (ref 79–97)
Monocytes Absolute: 0.3 10*3/uL (ref 0.1–0.9)
Monocytes: 7 %
Neutrophils Absolute: 2.7 10*3/uL (ref 1.4–7.0)
Neutrophils: 64 %
Platelets: 283 10*3/uL (ref 150–450)
RBC: 4.72 x10E6/uL (ref 3.77–5.28)
RDW: 17.7 % — ABNORMAL HIGH (ref 11.7–15.4)
WBC: 4.3 10*3/uL (ref 3.4–10.8)

## 2023-07-27 LAB — LIPID PANEL
Chol/HDL Ratio: 3.5 ratio (ref 0.0–4.4)
Cholesterol, Total: 217 mg/dL — ABNORMAL HIGH (ref 100–199)
HDL: 62 mg/dL (ref >39)
LDL Chol Calc (NIH): 144 mg/dL — ABNORMAL HIGH (ref 0–99)
Triglycerides: 63 mg/dL (ref 0–149)
VLDL Cholesterol Cal: 11 mg/dL (ref 5–40)

## 2023-07-27 LAB — VITAMIN D 25 HYDROXY (VIT D DEFICIENCY, FRACTURES): Vit D, 25-Hydroxy: 34.3 ng/mL (ref 30.0–100.0)

## 2023-07-27 LAB — TSH: TSH: 1.52 u[IU]/mL (ref 0.450–4.500)

## 2023-07-27 NOTE — Progress Notes (Signed)
Leyani,   Thyroid looks great.  Vitamin D much better.  Kidney, liver, glucose look great.  Hemoglobin and WBC normal  LDL, bad cholesterol, not to goal.  HDL, good cholesterol, looks great TG look great.   10 year cardiovascular risk is under 7.5 percent which is good(above 7.5 we strongly suggest a statin medication) but LDL is not to goal. You could continue working on diet/exercise or we could start a low dose statin now if you wanted to. You do have some family hx of heart disease. Thoughts?   The 10-year ASCVD risk score (Arnett DK, et al., 2019) is: 3.6%   Values used to calculate the score:     Age: 62 years     Sex: Female     Is Non-Hispanic African American: No     Diabetic: No     Tobacco smoker: No     Systolic Blood Pressure: 114 mmHg     Is BP treated: No     HDL Cholesterol: 62 mg/dL     Total Cholesterol: 217 mg/dL

## 2023-12-06 ENCOUNTER — Ambulatory Visit (INDEPENDENT_AMBULATORY_CARE_PROVIDER_SITE_OTHER): Payer: BC Managed Care – PPO | Admitting: Physician Assistant

## 2023-12-06 ENCOUNTER — Encounter: Payer: Self-pay | Admitting: Physician Assistant

## 2023-12-06 VITALS — BP 110/66 | HR 86 | Ht 60.0 in | Wt 121.0 lb

## 2023-12-06 DIAGNOSIS — F339 Major depressive disorder, recurrent, unspecified: Secondary | ICD-10-CM

## 2023-12-06 DIAGNOSIS — Z8639 Personal history of other endocrine, nutritional and metabolic disease: Secondary | ICD-10-CM | POA: Diagnosis not present

## 2023-12-06 DIAGNOSIS — F419 Anxiety disorder, unspecified: Secondary | ICD-10-CM

## 2023-12-06 MED ORDER — ESCITALOPRAM OXALATE 10 MG PO TABS: 10.0000 mg | ORAL_TABLET | Freq: Every day | ORAL | 1 refills | Status: DC

## 2023-12-06 MED ORDER — ZEPBOUND 5 MG/0.5ML ~~LOC~~ SOAJ
5.0000 mg | SUBCUTANEOUS | 5 refills | Status: AC
Start: 2023-12-06 — End: ?

## 2023-12-06 NOTE — Progress Notes (Signed)
Established Patient Office Visit  Subjective   Patient ID: Renee Burns, female    DOB: May 05, 1961  Age: 63 y.o. MRN: [id number]  Chief Complaint  Patient presents with   Medical Management of Chronic Issues    6 mo fup on wgt management     HPI Pt is a 63 yo female who presents to the clinic to follow up on weight. She had been on zepbound 7.5mg  and she was down to her goal weight. She stopped due to cost but has noticed "food noise" coming back and wants to pay cash for zepbound to maintain weight. She had no side effects.    Mood is doing great. No concerns. Needs refills.   .. Active Ambulatory Problems    Diagnosis Date Noted   Hypercholesteremia 06/12/2014   H/O chronic ulcerative colitis 01/25/2013   Asthma, cough variant 06/12/2014   Affective disorder (HCC) 06/12/2014   Depression, recurrent (HCC) 06/12/2014   Abnormal weight gain 12/07/2014   Overweight 05/22/2016   H/O colostomy 05/22/2016   Macular degeneration, right eye 11/05/2017   Class 1 obesity due to excess calories without serious comorbidity with body mass index (BMI) of 34.0 to 34.9 in adult 11/06/2017   Anxiety 11/06/2017   Prediabetes 03/04/2021   Osteoporosis 04/01/2022   Post-menopausal 07/26/2023   History of obesity 07/26/2023   Resolved Ambulatory Problems    Diagnosis Date Noted   Obesity 12/07/2014   Elevated fasting glucose 02/22/2020   Pelvic fullness in female 03/17/2022   Osteopenia 03/17/2022   Past Medical History:  Diagnosis Date   Asthma    Hyperlipidemia    Ovarian cyst    Pneumonia    Ulcerative colitis (HCC)        Review of Systems  All other systems reviewed and are negative.     Objective:     BP 110/66   Pulse 86   Ht 5' (1.524 m)   Wt 121 lb (54.9 kg)   LMP 08/12/2012   SpO2 99%   BMI 23.63 kg/m  BP Readings from Last 3 Encounters:  12/06/23 110/66  07/26/23 114/74  06/01/23 124/78   Wt Readings from Last 3 Encounters:  12/06/23 121 lb  (54.9 kg)  07/26/23 138 lb (62.6 kg)  06/01/23 158 lb 0.6 oz (71.7 kg)    ...    12/06/2023    8:39 AM 07/26/2023    9:46 AM 06/01/2023   11:12 AM 06/01/2023   10:58 AM 03/17/2022    9:06 AM  Depression screen PHQ 2/9  Decreased Interest 0 0 1 1 1   Down, Depressed, Hopeless 0 0 1 1 1   PHQ - 2 Score 0 0 2 2 2   Altered sleeping 0  0  0  Tired, decreased energy 0  1  0  Change in appetite 0    1  Feeling bad or failure about yourself  0  1  1  Trouble concentrating 0  0  0  Moving slowly or fidgety/restless 0  0  0  Suicidal thoughts 0  0  0  PHQ-9 Score 0  4  4  Difficult doing work/chores Not difficult at all  Not difficult at all  Not difficult at all   ..    12/06/2023    8:40 AM 06/01/2023   11:13 AM 03/17/2022    9:06 AM 02/28/2021    9:31 AM  GAD 7 : Generalized Anxiety Score  Nervous, Anxious, on Edge 0 1 1 1  Control/stop worrying 0 0 0 0  Worry too much - different things 0 0 0 0  Trouble relaxing 0 0 0 0  Restless 0 0 1 0  Easily annoyed or irritable 0 1 1 1   Afraid - awful might happen 0 0 0 1  Total GAD 7 Score 0 2 3 3   Anxiety Difficulty Not difficult at all Not difficult at all Not difficult at all Not difficult at all      Physical Exam Constitutional:      Appearance: Normal appearance.  HENT:     Head: Normocephalic.  Cardiovascular:     Rate and Rhythm: Normal rate and regular rhythm.  Pulmonary:     Effort: Pulmonary effort is normal.     Breath sounds: Normal breath sounds.  Musculoskeletal:     Right lower leg: No edema.     Left lower leg: No edema.  Neurological:     General: No focal deficit present.     Mental Status: She is alert and oriented to person, place, and time.  Psychiatric:        Mood and Affect: Mood normal.        The 10-year ASCVD risk score (Arnett DK, et al., 2019) is: 3.3%    Assessment & Plan:  Marland KitchenMarland KitchenVerenis was seen today for medical management of chronic issues.  Diagnoses and all orders for this  visit:  History of obesity -     tirzepatide (ZEPBOUND) 5 MG/0.5ML Pen; Inject 5 mg into the skin once a week.  Depression, recurrent (HCC) -     escitalopram (LEXAPRO) 10 MG tablet; Take 1 tablet (10 mg total) by mouth daily.  Anxiety -     escitalopram (LEXAPRO) 10 MG tablet; Take 1 tablet (10 mg total) by mouth daily.   PHQ/GAD no concerns Refilled lexapro  Will restart zepbound for maintain of weight   Tandy Gaw, PA-C

## 2024-04-20 ENCOUNTER — Other Ambulatory Visit: Payer: Self-pay | Admitting: Physician Assistant

## 2024-04-20 DIAGNOSIS — J45991 Cough variant asthma: Secondary | ICD-10-CM

## 2024-04-21 IMAGING — US US PELVIS COMPLETE WITH TRANSVAGINAL
1 series · 14 of 25 positions shown · non-contrast
Comparison: None Available.

CLINICAL DATA: Pelvic fullness for 1 month

EXAM:
TRANSABDOMINAL AND TRANSVAGINAL ULTRASOUND OF PELVIS
TECHNIQUE: Both transabdominal and transvaginal ultrasound examinations of the
pelvis were performed. Transabdominal technique was performed for
global imaging of the pelvis including uterus, ovaries, adnexal
regions, and pelvic cul-de-sac. It was necessary to proceed with
endovaginal exam following the transabdominal exam to visualize the
uterus and adnexa.

[Series 1: us pelvic complete with transvaginal · 14 of 62 slices shown]
[im 1/62]
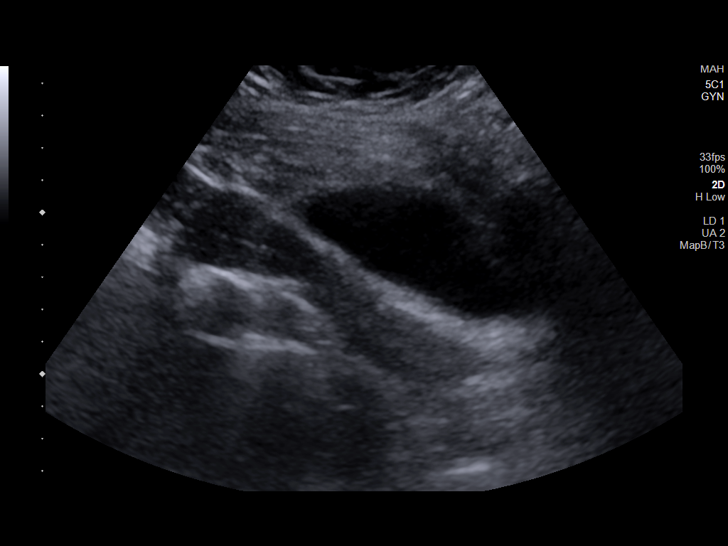
[im 6/62]
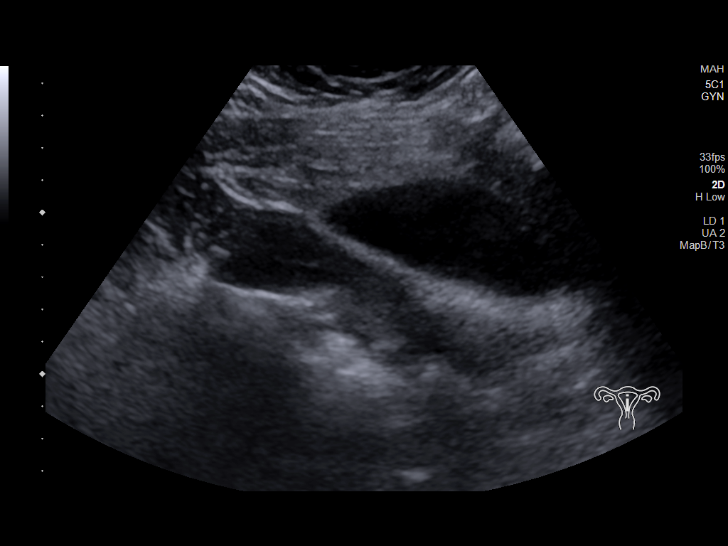
[im 11/62]
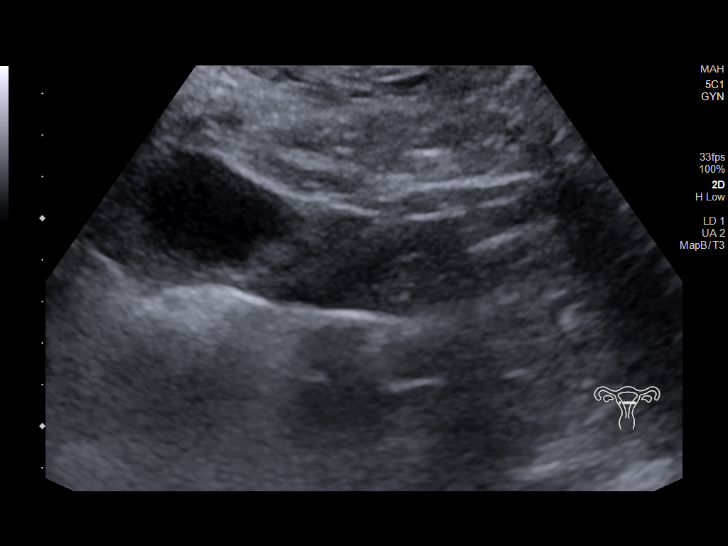
[im 16/62]
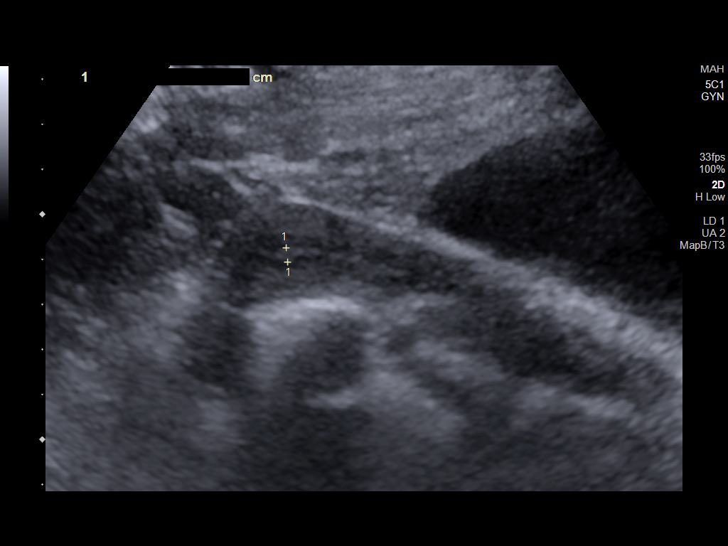
[im 21/62]
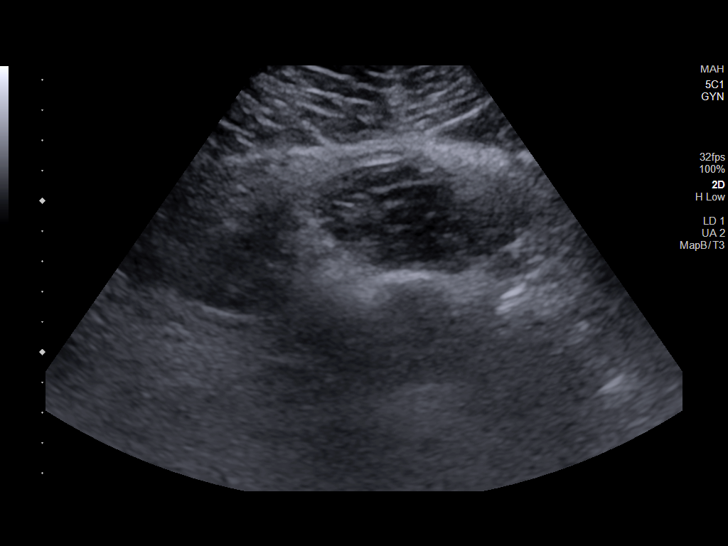
[im 23/62]
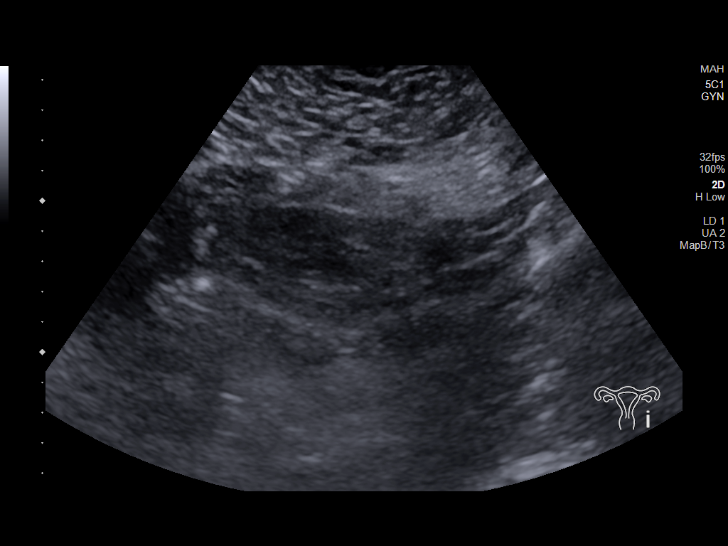
[im 28/62]
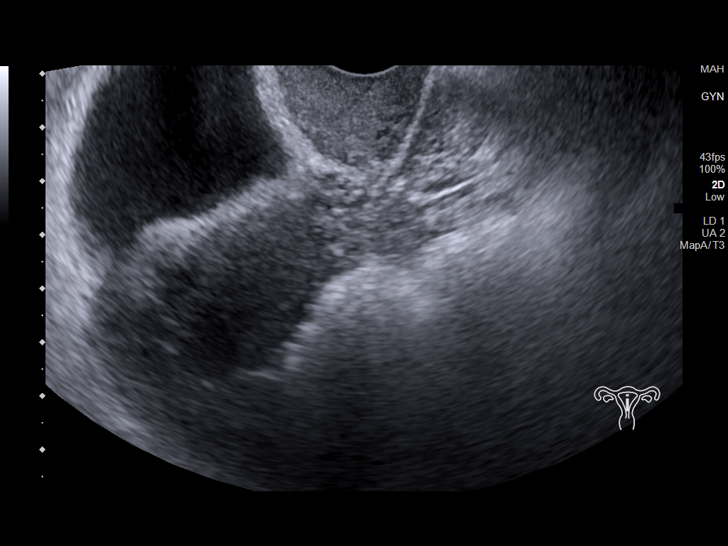
[im 34/62]
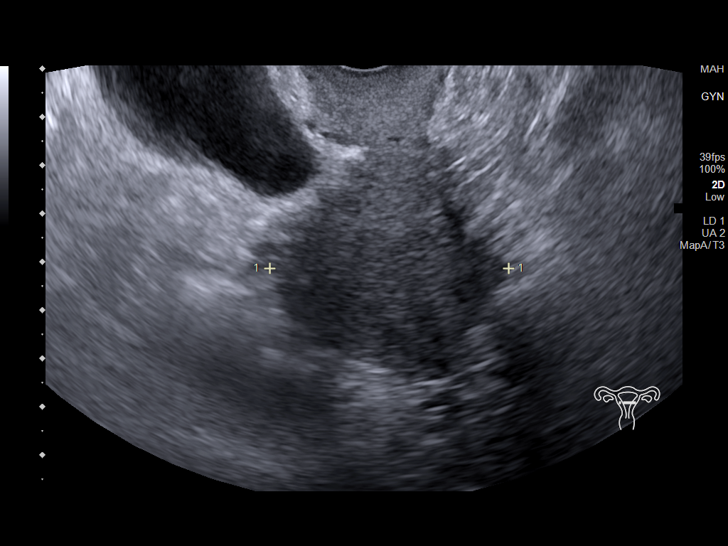
[im 39/62]
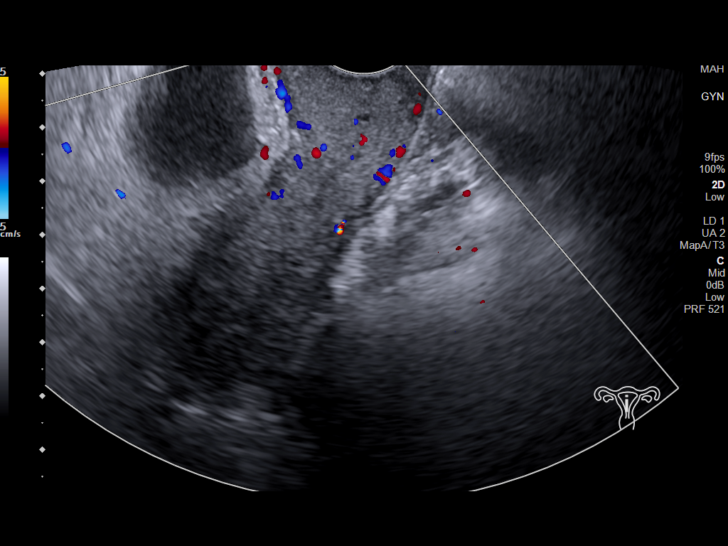
[im 41/62]
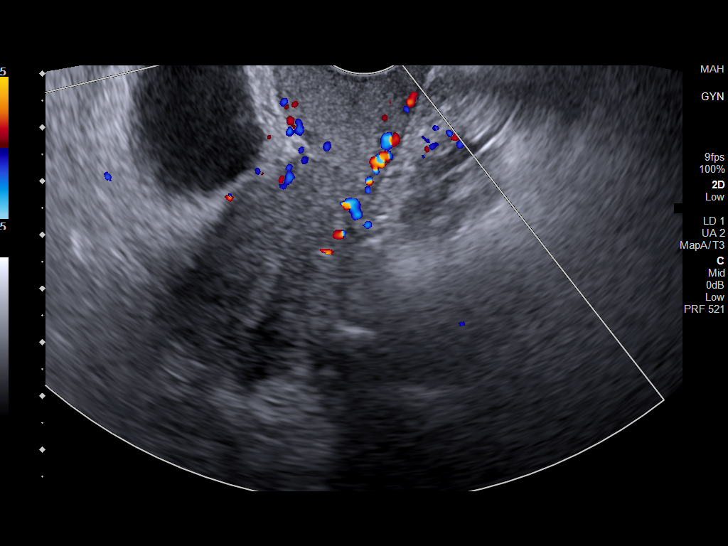
[im 46/62]
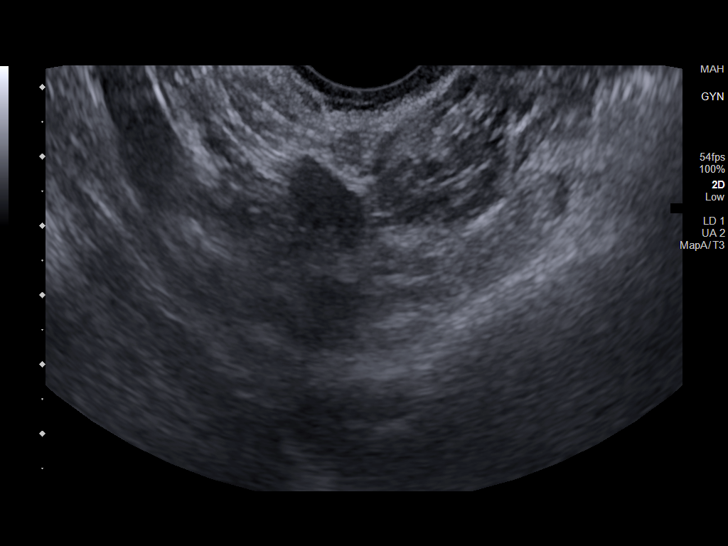
[im 51/62]
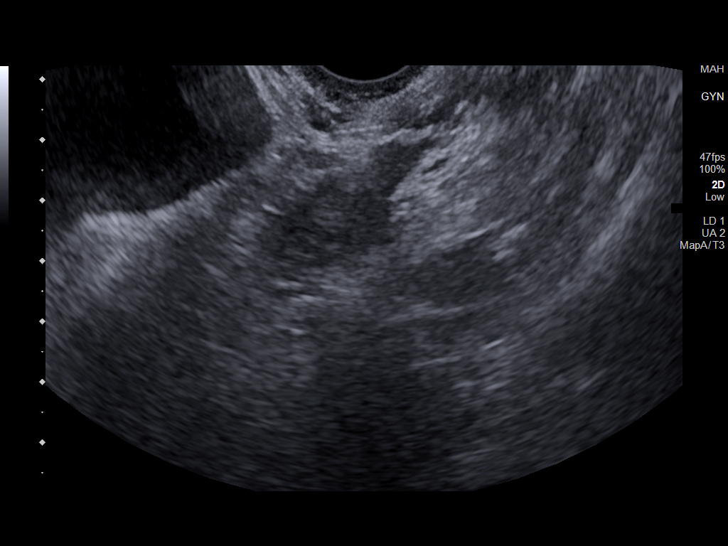
[im 56/62]
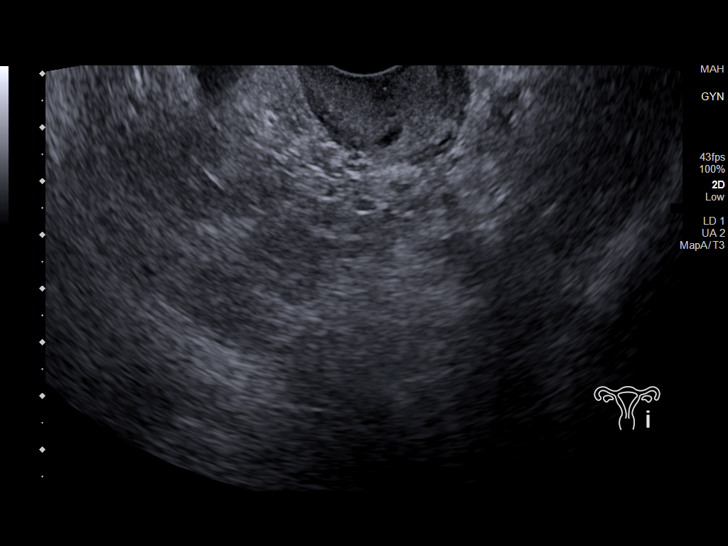
[im 62/62]
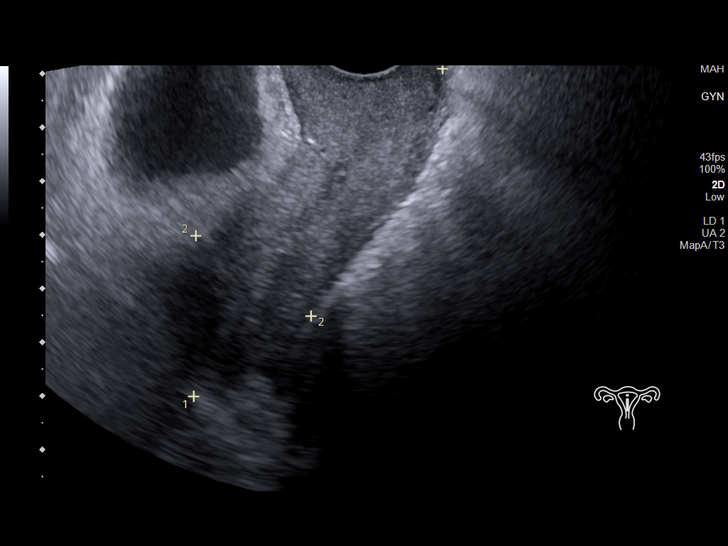

[14 of 25 positions shown; findings below may reference images not displayed]

FINDINGS: Uterus

Measurements: 10.1 x 2.7 x 4.9 cm = volume: 70.1 mL. No fibroids or
other mass visualized.

Endometrium

Thickness: 4.6 mm.  No focal abnormality visualized.

Right ovary

Measurements: 3.6 x 1.6 x 1.9 cm = volume: 5.6 mL. Normal
appearance/no adnexal mass.

Left ovary

Not seen.

Other findings

No abnormal free fluid.
IMPRESSION: Nonvisualized left ovary.  Otherwise negative pelvic ultrasound.

## 2024-05-17 ENCOUNTER — Ambulatory Visit
Admission: EM | Admit: 2024-05-17 | Discharge: 2024-05-17 | Disposition: A | Discharge status: Home / Self Care | Attending: Family Medicine | Admitting: Family Medicine

## 2024-05-17 DIAGNOSIS — S61213A Laceration without foreign body of left middle finger without damage to nail, initial encounter: Secondary | ICD-10-CM

## 2024-05-17 MED ORDER — DOXYCYCLINE HYCLATE 100 MG PO CAPS: 100.0000 mg | ORAL_CAPSULE | Freq: Two times a day (BID) | ORAL | 0 refills | Status: AC

## 2024-05-17 NOTE — ED Triage Notes (Signed)
 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 Pt reports she thinks she is up to date on her tdap.

## 2024-05-17 NOTE — ED Provider Notes (Signed)
 [name]    CSN: [id number] Arrival date & time: 05/17/24  1739      History   Chief Complaint Chief Complaint  Patient presents with   Laceration    HPI Renee Burns is a 63 y.o. female.   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  Patient reports is up-to-date on her Tdap.  Reports.  PMH significant for anxiety, HLD, and UC.  Past Medical History:  Diagnosis Date   Anxiety    Asthma    Hyperlipidemia    Ovarian cyst    Pneumonia    Ulcerative colitis [facility])     Patient Active Problem List   Diagnosis Date Noted   Post-menopausal 07/26/2023   History of obesity 07/26/2023   Osteoporosis 04/01/2022   Prediabetes 03/04/2021   Class 1 obesity due to excess calories without serious comorbidity with body mass index (BMI) of 34.0 to 34.9 in adult 11/06/2017   Anxiety 11/06/2017   Macular degeneration, right eye 11/05/2017   Overweight 05/22/2016   H/O colostomy 05/22/2016   Abnormal weight gain 12/07/2014   Hypercholesteremia 06/12/2014   Asthma, cough variant 06/12/2014   Affective disorder (HCC) 06/12/2014   Depression, recurrent (HCC) 06/12/2014   H/O chronic ulcerative colitis 01/25/2013    Past Surgical History:  Procedure Laterality Date   CESAREAN SECTION     CHOLECYSTECTOMY     COLOSTOMY      OB History     Gravida  2   Para  2   Term  1   Preterm  1   AB      Living  2      SAB      IAB      Ectopic      Multiple      Live Births               Home Medications    Prior to Admission medications   Medication Sig Start Date End Date Taking? Authorizing Provider  doxycycline  (VIBRAMYCIN ) 100 MG capsule Take 1 capsule (100 mg total) by mouth 2 (two) times daily for 5 days. 05/17/24 05/22/24 Yes Teddy Sharper, FNP  tirzepatide  (ZEPBOUND ) 5 MG/0.5ML Pen Inject 5 mg into the skin once a week. 12/06/23  Yes Breeback, Jade L,  PA-C  albuterol  (VENTOLIN  HFA) 108 (90 Base) MCG/ACT inhaler TAKE 2 PUFFS BY MOUTH EVERY 6 HOURS AS NEEDED FOR WHEEZE OR SHORTNESS OF BREATH 04/24/24   Breeback, Jade L, PA-C  buPROPion  (WELLBUTRIN  SR) 150 MG 12 hr tablet TAKE 1 TABLET BY MOUTH TWICE A DAY 05/26/23   Breeback, Jade L, PA-C  escitalopram  (LEXAPRO ) 10 MG tablet Take 1 tablet (10 mg total) by mouth daily. 12/06/23   Breeback, Jade L, PA-C  fluticasone  (FLONASE ) 50 MCG/ACT nasal spray Place 2 sprays into both nostrils daily. 02/21/20   Breeback, Jade L, PA-C  fluticasone  (FLOVENT  HFA) 44 MCG/ACT inhaler Inhale 2 puffs into the lungs 2 (two) times daily. 03/17/22   Breeback, Jade L, PA-C  Multiple Minerals-Vitamins (CALCIUM CITRATE +) TABS Take by mouth daily.    [provider]  Multiple Vitamins-Minerals (WOMENS MULTIVITAMIN PLUS) TABS Take 1 tablet by mouth.    [provider]    Family History Family History  Problem Relation Age of Onset   Heart disease Father    Cancer Father        bladder CA   Diabetes Mother  Social History Social History   Tobacco Use   Smoking status: Never   Smokeless tobacco: Never  Substance Use Topics   Alcohol use: No   Drug use: No     Allergies   Augmentin  [amoxicillin -pot clavulanate] and Loracarbef   Review of Systems Review of Systems  Skin:  Positive for wound.     Physical Exam Triage Vital Signs ED Triage Vitals  Encounter Vitals Group     BP      Girls Systolic BP Percentile      Girls Diastolic BP Percentile      Boys Systolic BP Percentile      Boys Diastolic BP Percentile      Pulse      Resp      Temp      Temp src      SpO2      Weight      Height      Head Circumference      Peak Flow      Pain Score      Pain Loc      Pain Education      Exclude from Growth Chart    No data found.  Updated Vital Signs BP 126/80   Pulse 66   Temp 98 F (36.7 C)   Resp 19   LMP 08/12/2012   SpO2 98%    Physical Exam Vitals and nursing  note reviewed.  Constitutional:      Appearance: Normal appearance. She is normal weight.  HENT:     Head: Normocephalic and atraumatic.     Mouth/Throat:     Mouth: Mucous membranes are moist.     Pharynx: Oropharynx is clear.  Eyes:     Extraocular Movements: Extraocular movements intact.     Conjunctiva/sclera: Conjunctivae normal.     Pupils: Pupils are equal, round, and reactive to light.  Cardiovascular:     Rate and Rhythm: Normal rate and regular rhythm.     Pulses: Normal pulses.     Heart sounds: Normal heart sounds.  Pulmonary:     Effort: Pulmonary effort is normal.     Breath sounds: Normal breath sounds. No wheezing, rhonchi or rales.  Musculoskeletal:        General: Normal range of motion.     Cervical back: Normal range of motion and neck supple.  Skin:    General: Skin is warm and dry.     Comments: Left middle finger (dorsum):~1.0 cm avulsion flap over proximal/intermediate phalanx please see image below; thumb tourniquet used to achieve homeostasis.  Wound cleansed with antiseptic spray. Dermabond used to approximate wound edges, after allowing to dry for 10, prior to Xeroform, nonadherent Telfa, then nonrestrictive Coban adhesive wrap with specific instructions provided to patient  Neurological:     General: No focal deficit present.     Mental Status: She is alert and oriented to person, place, and time. Mental status is at baseline.  Psychiatric:        Mood and Affect: Mood normal.        Behavior: Behavior normal.      UC Treatments / Results  Labs (all labs ordered are listed, but only abnormal results are displayed) Labs Reviewed - No data to display  EKG   Radiology No results found.  Procedures Procedures (including critical care time)  Medications Ordered in UC Medications - No data to display  Initial Impression / Assessment and Plan / UC Course  I  have reviewed the triage vital signs and the nursing notes.  Pertinent labs &  imaging results that were available during my care of the patient were reviewed by me and considered in my medical decision making (see chart for details).     MDM: 1.  Laceration of left middle finger without foreign body without damage to nail, initial encounter-skin avulsion of her proximal/intermediate phalanx of left third finger please see image in procedure note above. Rx'd doxycycline  100 mg capsule: Take 1 capsule twice daily x 5 days. Advised patient to keep wound dressing moist, covered and immobilize for the next days.  Advised patient to follow-up here on Friday, 05/20/2023 for wound care follow-up.  Advised patient to take medication as directed with food to completion.  Encouraged increase daily water intake to 64 ounces per day while taking this medication.  Advised if symptoms worsen and/or unresolved please follow-up with your PCP or here for wound care follow-up.  Final Clinical Impressions(s) / UC Diagnoses   Final diagnoses:  Laceration of left middle finger w/o foreign body w/o damage to nail, initial encounter     Discharge Instructions      Advised patient to keep wound dressing moist, covered and immobilize for the next days.  Advised patient to follow-up here on Friday, 05/20/2023 for wound care follow-up.  Advised patient to take medication as directed with food to completion.  Encouraged increase daily water intake to 64 ounces per day while taking this medication.  Advised if symptoms worsen and/or unresolved please follow-up with your PCP or here for wound care follow-up.     ED Prescriptions     Medication Sig Dispense Auth. Provider   doxycycline  (VIBRAMYCIN ) 100 MG capsule Take 1 capsule (100 mg total) by mouth 2 (two) times daily for 5 days. 10 capsule Renee Mault, FNP      PDMP not reviewed this encounter.   Teddy Sharper, FNP 05/17/24 1835

## 2024-05-17 NOTE — Discharge Instructions (Addendum)
 Advised patient to keep wound dressing moist, covered and immobilize for the next days.  Advised patient to follow-up here on Friday, 05/20/2023 for wound care follow-up.  Advised patient to take medication as directed with food to completion.  Encouraged increase daily water intake to 64 ounces per day while taking this medication.  Advised if symptoms worsen and/or unresolved please follow-up with your PCP or here for wound care follow-up.

## 2024-05-19 ENCOUNTER — Ambulatory Visit: Admission: EM | Admit: 2024-05-19 | Discharge: 2024-05-19 | Disposition: A | Discharge status: Home / Self Care

## 2024-05-19 ENCOUNTER — Other Ambulatory Visit: Payer: Self-pay

## 2024-05-19 DIAGNOSIS — Z5189 Encounter for other specified aftercare: Secondary | ICD-10-CM | POA: Diagnosis not present

## 2024-05-19 NOTE — ED Provider Notes (Signed)
 [name]    CSN: [id number] Arrival date & time: 05/19/24  0801      History   Chief Complaint Chief Complaint  Patient presents with   Follow-up    HPI Blimy Napoleon is a 63 y.o. female.   Patient returns for follow-up of laceration dorsal surface of her left middle finger PIP joint.  She has been wearing her finger splint and reports no pain, swelling, or drainage.     Past Medical History:  Diagnosis Date   Anxiety    Asthma    Hyperlipidemia    Ovarian cyst    Pneumonia    Ulcerative colitis [facility])     Patient Active Problem List   Diagnosis Date Noted   Post-menopausal 07/26/2023   History of obesity 07/26/2023   Osteoporosis 04/01/2022   Prediabetes 03/04/2021   Class 1 obesity due to excess calories without serious comorbidity with body mass index (BMI) of 34.0 to 34.9 in adult 11/06/2017   Anxiety 11/06/2017   Macular degeneration, right eye 11/05/2017   Overweight 05/22/2016   H/O colostomy 05/22/2016   Abnormal weight gain 12/07/2014   Hypercholesteremia 06/12/2014   Asthma, cough variant 06/12/2014   Affective disorder (HCC) 06/12/2014   Depression, recurrent (HCC) 06/12/2014   H/O chronic ulcerative colitis 01/25/2013    Past Surgical History:  Procedure Laterality Date   CESAREAN SECTION     CHOLECYSTECTOMY     COLOSTOMY      OB History     Gravida  2   Para  2   Term  1   Preterm  1   AB      Living  2      SAB      IAB      Ectopic      Multiple      Live Births               Home Medications    Prior to Admission medications   Medication Sig Start Date End Date Taking? Authorizing Provider  albuterol  (VENTOLIN  HFA) 108 (90 Base) MCG/ACT inhaler TAKE 2 PUFFS BY MOUTH EVERY 6 HOURS AS NEEDED FOR WHEEZE OR SHORTNESS OF BREATH 04/24/24   Breeback, Jade L, PA-C  buPROPion  (WELLBUTRIN  SR) 150 MG 12 hr tablet TAKE 1 TABLET BY MOUTH TWICE A DAY 05/26/23   Breeback, Jade L, PA-C  doxycycline   (VIBRAMYCIN ) 100 MG capsule Take 1 capsule (100 mg total) by mouth 2 (two) times daily for 5 days. 05/17/24 05/22/24  Teddy Sharper, FNP  escitalopram  (LEXAPRO ) 10 MG tablet Take 1 tablet (10 mg total) by mouth daily. 12/06/23   Breeback, Jade L, PA-C  fluticasone  (FLONASE ) 50 MCG/ACT nasal spray Place 2 sprays into both nostrils daily. 02/21/20   Breeback, Jade L, PA-C  fluticasone  (FLOVENT  HFA) 44 MCG/ACT inhaler Inhale 2 puffs into the lungs 2 (two) times daily. 03/17/22   Breeback, Jade L, PA-C  Multiple Minerals-Vitamins (CALCIUM CITRATE +) TABS Take by mouth daily.    [provider]  Multiple Vitamins-Minerals (WOMENS MULTIVITAMIN PLUS) TABS Take 1 tablet by mouth.    [provider]  tirzepatide  (ZEPBOUND ) 5 MG/0.5ML Pen Inject 5 mg into the skin once a week. 12/06/23   Antoniette Vermell CROME, PA-C    Family History Family History  Problem Relation Age of Onset   Heart disease Father    Cancer Father        bladder CA   Diabetes Mother  Social History Social History   Tobacco Use   Smoking status: Never   Smokeless tobacco: Never  Substance Use Topics   Alcohol use: No   Drug use: No     Allergies   Augmentin  [amoxicillin -pot clavulanate] and Loracarbef   Review of Systems Review of Systems  Constitutional:  Negative for chills, diaphoresis, fatigue and fever.  Skin:  Positive for wound. Negative for color change.  All other systems reviewed and are negative.    Physical Exam Triage Vital Signs ED Triage Vitals  Encounter Vitals Group     BP 05/19/24 0808 124/77     Girls Systolic BP Percentile --      Girls Diastolic BP Percentile --      Boys Systolic BP Percentile --      Boys Diastolic BP Percentile --      Pulse Rate 05/19/24 0808 78     Resp 05/19/24 0808 16     Temp 05/19/24 0808 97.7 F (36.5 C)     Temp src --      SpO2 05/19/24 0808 100 %     Weight --      Height --      Head Circumference --      Peak Flow --      Pain Score  05/19/24 0810 0     Pain Loc --      Pain Education --      Exclude from Growth Chart --    No data found.  Updated Vital Signs BP 124/77   Pulse 78   Temp 97.7 F (36.5 C)   Resp 16   LMP 08/12/2012   SpO2 100%   Visual Acuity Right Eye Distance:   Left Eye Distance:   Bilateral Distance:    Right Eye Near:   Left Eye Near:    Bilateral Near:     Physical Exam Vitals and nursing note reviewed.  Constitutional:      General: She is not in acute distress. Eyes:     Pupils: Pupils are equal, round, and reactive to light.  Cardiovascular:     Rate and Rhythm: Normal rate.  Pulmonary:     Effort: Pulmonary effort is normal.  Musculoskeletal:     Left hand: No swelling or tenderness.     Comments: Left middle finger PIP joint has Dermabond in place dorsally over healing laceration.  No erythema, swelling, or tenderness to palpation.  Limited flexion/extension without difficulty.  Skin:    General: Skin is warm and dry.  Neurological:     Mental Status: She is alert.      UC Treatments / Results  Labs (all labs ordered are listed, but only abnormal results are displayed) Labs Reviewed - No data to display  EKG   Radiology No results found.  Procedures Procedures (including critical care time)  Medications Ordered in UC Medications - No data to display  Initial Impression / Assessment and Plan / UC Course  I have reviewed the triage vital signs and the nursing notes.  Pertinent labs & imaging results that were available during my care of the patient were reviewed by me and considered in my medical decision making (see chart for details).    Wound healing well.  No evidence infection.  Advised that she may begin using her left hand at work but limit flexion/extension for about five days.  Although a dressing is not necessary, applied band-aid to remind her to limit flexion. Return as needed.  Final Clinical Impressions(s) / UC Diagnoses   Final  diagnoses:  Encounter for wound care     Discharge Instructions      Finish antibiotic.  May begin flexing finger gently but avoid full flexion for several more days.  Wear bandaid for several more days until healed.    ED Prescriptions   None    PDMP not reviewed this encounter.   Pauline Garnette LABOR, MD 05/19/24 0900

## 2024-05-19 NOTE — ED Triage Notes (Signed)
 Wound f/u for laceration to left middle finger

## 2024-05-19 NOTE — Discharge Instructions (Signed)
 Finish antibiotic.  May begin flexing finger gently but avoid full flexion for several more days.  Wear bandaid for several more days until healed.

## 2024-06-02 ENCOUNTER — Other Ambulatory Visit: Payer: Self-pay | Admitting: Physician Assistant

## 2024-06-02 DIAGNOSIS — Z1231 Encounter for screening mammogram for malignant neoplasm of breast: Secondary | ICD-10-CM

## 2024-06-19 DIAGNOSIS — L814 Other melanin hyperpigmentation: Secondary | ICD-10-CM | POA: Diagnosis not present

## 2024-06-19 DIAGNOSIS — D235 Other benign neoplasm of skin of trunk: Secondary | ICD-10-CM | POA: Diagnosis not present

## 2024-06-19 DIAGNOSIS — D225 Melanocytic nevi of trunk: Secondary | ICD-10-CM | POA: Diagnosis not present

## 2024-06-19 DIAGNOSIS — L820 Inflamed seborrheic keratosis: Secondary | ICD-10-CM | POA: Diagnosis not present

## 2024-06-19 DIAGNOSIS — D485 Neoplasm of uncertain behavior of skin: Secondary | ICD-10-CM | POA: Diagnosis not present

## 2024-06-19 DIAGNOSIS — L739 Follicular disorder, unspecified: Secondary | ICD-10-CM | POA: Diagnosis not present

## 2024-06-22 ENCOUNTER — Ambulatory Visit

## 2024-06-24 ENCOUNTER — Other Ambulatory Visit: Payer: Self-pay | Admitting: Medical Genetics

## 2024-06-29 ENCOUNTER — Ambulatory Visit

## 2024-07-18 ENCOUNTER — Ambulatory Visit
Admission: EM | Admit: 2024-07-18 | Discharge: 2024-07-18 | Disposition: A | Discharge status: Home / Self Care | Attending: Family Medicine | Admitting: Family Medicine

## 2024-07-18 ENCOUNTER — Ambulatory Visit (INDEPENDENT_AMBULATORY_CARE_PROVIDER_SITE_OTHER): Admitting: Sports Medicine

## 2024-07-18 ENCOUNTER — Ambulatory Visit (INDEPENDENT_AMBULATORY_CARE_PROVIDER_SITE_OTHER)

## 2024-07-18 VITALS — BP 120/60 | Ht 60.0 in | Wt 120.0 lb

## 2024-07-18 DIAGNOSIS — M7732 Calcaneal spur, left foot: Secondary | ICD-10-CM | POA: Diagnosis not present

## 2024-07-18 DIAGNOSIS — S99922A Unspecified injury of left foot, initial encounter: Secondary | ICD-10-CM

## 2024-07-18 DIAGNOSIS — M79672 Pain in left foot: Secondary | ICD-10-CM

## 2024-07-18 DIAGNOSIS — S92352B Displaced fracture of fifth metatarsal bone, left foot, initial encounter for open fracture: Secondary | ICD-10-CM | POA: Diagnosis not present

## 2024-07-18 DIAGNOSIS — S92352A Displaced fracture of fifth metatarsal bone, left foot, initial encounter for closed fracture: Secondary | ICD-10-CM | POA: Diagnosis not present

## 2024-07-18 MED ORDER — OXYCODONE-ACETAMINOPHEN 5-325 MG PO TABS: 1.0000 | ORAL_TABLET | Freq: Four times a day (QID) | ORAL | 0 refills | Status: AC | PRN

## 2024-07-18 MED ORDER — CELECOXIB 200 MG PO CAPS: 200.0000 mg | ORAL_CAPSULE | Freq: Every day | ORAL | 0 refills | Status: AC

## 2024-07-18 NOTE — Discharge Instructions (Addendum)
 Advised patient of left foot x-ray results with hardcopy and image provided.  Advised patient to wear cam walker boot 24/7 until being evaluated by [facility] orthopedics.  Advised patient to take Celebrex  daily for the next 15 days.  Advised may use Percocet daily or as needed for acute/severe left foot pain.  Patient advised of sedative effects of this medication.  Advised patient to RICE affected area of left foot for 30 minutes 3 times daily for the next 3 days.

## 2024-07-18 NOTE — Progress Notes (Signed)
   Subjective:    Patient ID: Renee Burns, female    DOB: 1961/05/12, 63 y.o.   MRN: [id number]  HPI chief complaint: Left foot pain  Renee Burns is a very pleasant 63 year old female that presents today after having suffered a mildly displaced fifth metatarsal fracture in her left foot earlier this morning.  She had a syncopal episode while in the bathroom.  Was seen earlier today at urgent care where x-rays revealed a fracture.  She was placed into a cam walker and comes in today for further evaluation and treatment.  Past medical history reviewed Medications reviewed Allergies reviewed   Review of Systems As above    Objective:   Physical Exam  Well-developed, well-nourished.  No acute distress  Left foot: Mild swelling diffusely across the dorsum of the left foot.  No skin breakdown.  Tender to palpation along the lateral foot.  Good pulses.  X-rays including AP, lateral, and oblique views of the left foot shows a mildly displaced obliquely oriented fracture through the fifth metatarsal shaft.      Assessment & Plan:   Mildly displaced fifth metatarsal fracture, left foot  Alignment is currently acceptable, but I think this would be best followed by orthopedics.  We will refer her to either Dr. Cristy or Dr. Josefina for further management.  In the meantime, she will be nonweightbearing on a knee scooter in her cam walker.  I will defer further workup and treatment to the discretion of orthopedics.  Follow-up with me as needed.  This note was dictated using Dragon naturally speaking software and may contain errors in syntax, spelling, or content which have not been identified prior to signing this note.

## 2024-07-18 NOTE — ED Provider Notes (Signed)
 [name]    CSN: [id number] Arrival date & time: 07/18/24  0835      History   Chief Complaint Chief Complaint  Patient presents with   Foot Pain    HPI Renee Burns is a 63 y.o. female.   HPI Pleasant 63 year old female presents with left foot pain secondary to left foot injury that occurred at 1 AM this morning in her bathroom.  Patient reports attempting to help her husband who is sick in bathroom with stomach flu when she accidentally slipped turning her left foot awkwardly.  PMH significant for UC and HLD.  Past Medical History:  Diagnosis Date   Anxiety    Asthma    Hyperlipidemia    Ovarian cyst    Pneumonia    Ulcerative colitis [facility])     Patient Active Problem List   Diagnosis Date Noted   Post-menopausal 07/26/2023   History of obesity 07/26/2023   Osteoporosis 04/01/2022   Prediabetes 03/04/2021   Class 1 obesity due to excess calories without serious comorbidity with body mass index (BMI) of 34.0 to 34.9 in adult 11/06/2017   Anxiety 11/06/2017   Macular degeneration, right eye 11/05/2017   Overweight 05/22/2016   H/O colostomy 05/22/2016   Abnormal weight gain 12/07/2014   Hypercholesteremia 06/12/2014   Asthma, cough variant 06/12/2014   Affective disorder (HCC) 06/12/2014   Depression, recurrent (HCC) 06/12/2014   H/O chronic ulcerative colitis 01/25/2013    Past Surgical History:  Procedure Laterality Date   CESAREAN SECTION     CHOLECYSTECTOMY     COLOSTOMY      OB History     Gravida  2   Para  2   Term  1   Preterm  1   AB      Living  2      SAB      IAB      Ectopic      Multiple      Live Births               Home Medications    Prior to Admission medications   Medication Sig Start Date End Date Taking? Authorizing Provider  celecoxib  (CELEBREX ) 200 MG capsule Take 1 capsule (200 mg total) by mouth daily for 15 days. 07/18/24 08/02/24 Yes Teddy Sharper, FNP  oxyCODONE -acetaminophen   (PERCOCET/ROXICET) 5-325 MG tablet Take 1 tablet by mouth every 6 (six) hours as needed for severe pain (pain score 7-10). 07/18/24  Yes Teddy Sharper, FNP  albuterol  (VENTOLIN  HFA) 108 (90 Base) MCG/ACT inhaler TAKE 2 PUFFS BY MOUTH EVERY 6 HOURS AS NEEDED FOR WHEEZE OR SHORTNESS OF BREATH 04/24/24   Breeback, Jade L, PA-C  buPROPion  (WELLBUTRIN  SR) 150 MG 12 hr tablet TAKE 1 TABLET BY MOUTH TWICE A DAY 05/26/23   Breeback, Jade L, PA-C  escitalopram  (LEXAPRO ) 10 MG tablet Take 1 tablet (10 mg total) by mouth daily. 12/06/23   Breeback, Jade L, PA-C  fluticasone  (FLONASE ) 50 MCG/ACT nasal spray Place 2 sprays into both nostrils daily. 02/21/20   Breeback, Jade L, PA-C  fluticasone  (FLOVENT  HFA) 44 MCG/ACT inhaler Inhale 2 puffs into the lungs 2 (two) times daily. 03/17/22   Breeback, Jade L, PA-C  Multiple Minerals-Vitamins (CALCIUM CITRATE +) TABS Take by mouth daily.    [provider]  Multiple Vitamins-Minerals (WOMENS MULTIVITAMIN PLUS) TABS Take 1 tablet by mouth.    [provider]  tirzepatide  (ZEPBOUND ) 5 MG/0.5ML Pen Inject 5 mg into the skin  once a week. 12/06/23   Antoniette Vermell CROME, PA-C    Family History Family History  Problem Relation Age of Onset   Heart disease Father    Cancer Father        bladder CA   Diabetes Mother     Social History Social History   Tobacco Use   Smoking status: Never   Smokeless tobacco: Never  Substance Use Topics   Alcohol use: No   Drug use: No     Allergies   Augmentin  [amoxicillin -pot clavulanate] and Loracarbef   Review of Systems Review of Systems  Musculoskeletal:        Left foot pain secondary to left foot injury sustained this morning at 1 AM     Physical Exam Triage Vital Signs ED Triage Vitals  Encounter Vitals Group     BP      Girls Systolic BP Percentile      Girls Diastolic BP Percentile      Boys Systolic BP Percentile      Boys Diastolic BP Percentile      Pulse      Resp      Temp      Temp  src      SpO2      Weight      Height      Head Circumference      Peak Flow      Pain Score      Pain Loc      Pain Education      Exclude from Growth Chart    No data found.  Updated Vital Signs BP 138/85   Pulse 69   Temp 98.5 F (36.9 C)   Resp 19   LMP 08/12/2012   SpO2 98%    Physical Exam Vitals and nursing note reviewed.  Constitutional:      Appearance: Normal appearance. She is normal weight.  HENT:     Head: Normocephalic and atraumatic.     Mouth/Throat:     Mouth: Mucous membranes are moist.     Pharynx: Oropharynx is clear.  Eyes:     Extraocular Movements: Extraocular movements intact.     Conjunctiva/sclera: Conjunctivae normal.     Pupils: Pupils are equal, round, and reactive to light.  Cardiovascular:     Rate and Rhythm: Normal rate and regular rhythm.     Pulses: Normal pulses.     Heart sounds: Normal heart sounds.  Pulmonary:     Effort: Pulmonary effort is normal.     Breath sounds: Normal breath sounds. No wheezing, rhonchi or rales.  Musculoskeletal:        General: Normal range of motion.     Comments: Left foot (dorsum lateral aspect): Significant soft tissue swelling noted with ecchymosis-please see image below  Skin:    General: Skin is warm and dry.  Neurological:     General: No focal deficit present.     Mental Status: She is alert and oriented to person, place, and time. Mental status is at baseline.      UC Treatments / Results  Labs (all labs ordered are listed, but only abnormal results are displayed) Labs Reviewed - No data to display  EKG   Radiology DG Foot Complete Left Result Date: 07/18/2024 CLINICAL DATA:  Left foot pain after fall today. EXAM: LEFT FOOT - COMPLETE 3+ VIEW COMPARISON:  None Available. FINDINGS: Mildly displaced oblique fracture is seen involving fifth metatarsal. Minimal posterior calcaneal spurring is  noted. Joint spaces are unremarkable. IMPRESSION: Mildly displaced fifth metatarsal  fracture. Electronically Signed   By: Lynwood Landy Raddle M.D.   On: 07/18/2024 09:13    Procedures Procedures (including critical care time)  Medications Ordered in UC Medications - No data to display  Initial Impression / Assessment and Plan / UC Course  I have reviewed the triage vital signs and the nursing notes.  Pertinent labs & imaging results that were available during my care of the patient were reviewed by me and considered in my medical decision making (see chart for details).     MDM: 1.  Displaced fracture of fifth metatarsal bone, left foot, initial encounter for open fracture-left foot x-ray results revealed above patient advised, size 7 cam walker boot placed on the left foot prior to discharge this morning with specific instructions provided to patient.  Rx'd Percocet 5/325 mg tablet: Take 1 tablet every 6 hours as needed for severe pain; 2.  Foot pain, left-Rx'd Celebrex  200 mg capsule: Take 1 capsule daily x 15 days; 3.  Injury of left foot, initial encounter-patient reports falling accidentally in her bathroom at 1:00 this morning while helping her husband who was sick in bathroom with stomach flu. Advised patient of left foot x-ray results with hardcopy and image provided.  Advised patient to wear cam walker boot 24/7 until being evaluated by [facility] orthopedics.  Advised patient to take Celebrex  daily for the next 15 days.  Advised may use Percocet daily or as needed for acute/severe left foot pain.  Patient advised of sedative effects of this medication.  Advised patient to RICE affected area of left foot for 30 minutes 3 times daily for the next 3 days.  Patient discharged home, hemodynamically stable. Final Clinical Impressions(s) / UC Diagnoses   Final diagnoses:  Foot pain, left  Displaced fracture of fifth metatarsal bone, left foot, initial encounter for open fracture  Injury of left foot, initial encounter     Discharge Instructions      Advised patient of  left foot x-ray results with hardcopy and image provided.  Advised patient to wear cam walker boot 24/7 until being evaluated by [facility] orthopedics.  Advised patient to take Celebrex  daily for the next 15 days.  Advised may use Percocet daily or as needed for acute/severe left foot pain.  Patient advised of sedative effects of this medication.  Advised patient to RICE affected area of left foot for 30 minutes 3 times daily for the next 3 days.     ED Prescriptions     Medication Sig Dispense Auth. Provider   celecoxib  (CELEBREX ) 200 MG capsule Take 1 capsule (200 mg total) by mouth daily for 15 days. 15 capsule Diksha Tagliaferro, FNP   oxyCODONE -acetaminophen  (PERCOCET/ROXICET) 5-325 MG tablet Take 1 tablet by mouth every 6 (six) hours as needed for severe pain (pain score 7-10). 15 tablet Rylinn Linzy, FNP      I have reviewed the PDMP during this encounter.   Teddy Sharper, FNP 07/18/24 1045

## 2024-07-18 NOTE — Patient Instructions (Addendum)
 [facility] Address: [address] [place], [place] [address] Phone: [phone]  [facility] ODESS[facility] [address] Sidra Ol[place] Wed 07/19/24 @ 1045a [address] [place] [phone]

## 2024-07-18 NOTE — ED Triage Notes (Signed)
 Pt presents to uc with co left foot injury. Pt reports she was in the bathroom with her sick husband. Pt reports she started to feel light headed and fell. Pt reports pain and swelling since then.

## 2024-07-19 DIAGNOSIS — S92355A Nondisplaced fracture of fifth metatarsal bone, left foot, initial encounter for closed fracture: Secondary | ICD-10-CM | POA: Diagnosis not present

## 2024-07-26 DIAGNOSIS — S92355D Nondisplaced fracture of fifth metatarsal bone, left foot, subsequent encounter for fracture with routine healing: Secondary | ICD-10-CM | POA: Diagnosis not present

## 2024-08-13 ENCOUNTER — Other Ambulatory Visit: Payer: Self-pay | Admitting: Physician Assistant

## 2024-08-13 DIAGNOSIS — F339 Major depressive disorder, recurrent, unspecified: Secondary | ICD-10-CM

## 2024-08-13 DIAGNOSIS — F419 Anxiety disorder, unspecified: Secondary | ICD-10-CM

## 2024-08-17 ENCOUNTER — Ambulatory Visit (INDEPENDENT_AMBULATORY_CARE_PROVIDER_SITE_OTHER)

## 2024-08-17 DIAGNOSIS — Z1231 Encounter for screening mammogram for malignant neoplasm of breast: Secondary | ICD-10-CM | POA: Diagnosis not present

## 2024-08-22 ENCOUNTER — Ambulatory Visit: Payer: Self-pay | Admitting: Physician Assistant

## 2024-08-22 NOTE — Progress Notes (Signed)
 Normal mammogram. Follow up in 1 year.

## 2024-08-23 DIAGNOSIS — M79672 Pain in left foot: Secondary | ICD-10-CM | POA: Diagnosis not present

## 2024-08-23 DIAGNOSIS — S62327D Displaced fracture of shaft of fifth metacarpal bone, left hand, subsequent encounter for fracture with routine healing: Secondary | ICD-10-CM | POA: Diagnosis not present

## 2024-08-29 ENCOUNTER — Other Ambulatory Visit: Payer: Self-pay | Admitting: Medical Genetics

## 2024-08-29 DIAGNOSIS — Z006 Encounter for examination for normal comparison and control in clinical research program: Secondary | ICD-10-CM

## 2024-09-11 ENCOUNTER — Ambulatory Visit: Admitting: Physician Assistant

## 2024-09-11 VITALS — BP 138/64 | HR 95 | Temp 98.2°F | Ht 60.0 in | Wt 127.0 lb

## 2024-09-11 DIAGNOSIS — L03116 Cellulitis of left lower limb: Secondary | ICD-10-CM

## 2024-09-11 DIAGNOSIS — L02416 Cutaneous abscess of left lower limb: Secondary | ICD-10-CM

## 2024-09-11 MED ORDER — CLINDAMYCIN HCL 300 MG PO CAPS: 300.0000 mg | ORAL_CAPSULE | Freq: Three times a day (TID) | ORAL | 0 refills | Status: AC

## 2024-09-11 NOTE — Progress Notes (Signed)
   Established Patient Office Visit  Subjective   Patient ID: Renee Burns, female    DOB: Oct 29, 1961  Age: 63 y.o. MRN: [id number]  Chief Complaint  Patient presents with   Medical Management of Chronic Issues    HPI Discussed the use of AI scribe software for clinical note transcription with the patient, who gave verbal consent to proceed.  History of Present Illness Renee Burns is a 63 year old female who presents for follow-up after an insect bite and antibiotic treatment.  Cutaneous infection following insect bite - Severe reaction to insect bite while on a cruise - Initial presentation: small mark on skin progressing to a painful area - Pain described as severe, with sensation of impending death due to intensity - Affected area measures approximately 5.5 cm by 3.5 cm - Area remains slightly draining, managed with a Band-Aid - No current pain, fever, or chills - Muscle soreness occurred during infection, now resolved - Completed oral clindamycin course this morning; dosing was twice daily instead of every eight hours as prescribed - Received intravenous ceftriaxone during treatment - Similar infection occurred on hip several years ago, treated by a physician     ROS See HPI.    Objective:     BP 138/64   Pulse 95   Temp 98.2 F (36.8 C) (Oral)   Ht 5' (1.524 m)   Wt 127 lb (57.6 kg)   LMP 08/12/2012   SpO2 99%   BMI 24.80 kg/m  BP Readings from Last 3 Encounters:  09/11/24 138/64  07/18/24 120/60  07/18/24 138/85   Wt Readings from Last 3 Encounters:  09/11/24 127 lb (57.6 kg)  07/18/24 120 lb (54.4 kg)  12/06/23 121 lb (54.9 kg)      Physical Exam Left posterior lower leg just below knee. Induration and redness 5.5cm by 3.5cm with no warmth or tenderness. Central area of granulation tissue .5cm by .5cm with scant drainage.   The 10-year ASCVD risk score (Arnett DK, et al., 2019) is: 5.7%    Assessment & Plan:  [name] was seen today for  medical management of chronic issues.  Diagnoses and all orders for this visit:  Cellulitis and abscess of left leg -     clindamycin (CLEOCIN) 300 MG capsule; Take 1 capsule (300 mg total) by mouth 3 (three) times daily.    Assessment & Plan Cellulitis of left lower posterior leg Cellulitis improving with reduced pain and no fever, but still draining slightly and indurated.  - Prescribed additional 5 days of clindamycin, option to stop after 3 days if significant improvement. - Apply Vaseline gauze to affected area and keep wrapped up and covered until not draining and healed.  - Use warm compresses to help with blood flow - Keep legs elevated as much as you can - follow up if not continuing to heal or if starts hurting again     Sharanya Templin, PA-C

## 2024-09-11 NOTE — Patient Instructions (Addendum)
 Given 5 more days of clindamycin.   Cellulitis, Adult  Cellulitis is a skin infection. The infected area is usually warm, red, swollen, and tender. It most commonly occurs on the lower body, such as the legs, feet, and toes, but this condition can occur on any part of the body. The infection can travel to the muscles, blood, and underlying tissue and become life-threatening without treatment. It is important to get medical treatment right away for this condition. What are the causes? Cellulitis is caused by bacteria. The bacteria enter through a break in the skin, such as a cut, burn, insect or animal bite, open sore, or crack. What increases the risk? This condition is more likely to occur in people who: Have a weak body's defense system (immune system). Are older than 64 years old. Have diabetes. Have a type of long-term (chronic) liver disease (cirrhosis) or kidney disease. Are obese. Have a skin condition such as: An itchy rash, such as eczema or psoriasis. A fungal rash on the feet or in skinfolds. Blistering rashes, such as shingles or chickenpox. Slow movement of blood in the veins (venous stasis). Fluid buildup below the skin (edema). Have open wounds on the skin, such as cuts, puncture wounds, burns, bites, scrapes, tattoos, piercings, or wounds from surgery. Have had radiation therapy. Use IV drugs. What are the signs or symptoms? Symptoms of this condition include: Skin that looks red, purple, or slightly darker than your usual skin color. Streaks or spots on the skin. Swollen area of the skin. Tenderness or pain when an area of the skin is touched. Warm skin. Fever or chills. Blisters. Tiredness (fatigue). How is this diagnosed? This condition is diagnosed based on a medical history and physical exam. You may also have tests, including: Blood tests. Imaging tests. Tests on a sample of fluid taken from the wound (wound culture). How is this treated? Treatment for  this condition may include: Medicines. These may include antibiotics or medicines to treat allergies (antihistamines). Rest. Applying cold or warm wet cloths (compresses) to the skin. If the condition is severe, you may need to stay in the hospital and get antibiotics through an IV. The infection usually starts to get better within 1-2 days of treatment. Follow these instructions at home: Medicines Take over-the-counter and prescription medicines only as told by your health care provider. If you were prescribed antibiotics, take them as told by your provider. Do not stop using the antibiotic even if you start to feel better. General instructions Drink enough fluid to keep your pee (urine) pale yellow. Do not touch or rub the infected area. Raise (elevate) the infected area above the level of your heart while you are sitting or lying down. Return to your normal activities as told by your provider. Ask your provider what activities are safe for you. Apply warm or cold compresses to the affected area as told by your provider. Keep all follow-up visits. Your provider will need to make sure that a more serious infection is not developing. Contact a health care provider if: You have a fever. Your symptoms do not improve within 1-2 days of starting treatment or you develop new symptoms. Your bone or joint underneath the infected area becomes painful after the skin has healed. Your infection returns in the same area or another area. Signs of this may include: You notice a swollen bump in the infected area. Your red area gets larger, turns dark in color, or becomes more painful. Drainage increases. Pus or a  bad smell develops in your infected area. You have more pain. You feel ill and have muscle aches and weakness. You develop vomiting or diarrhea that will not go away. Get help right away if: You notice red streaks coming from the infected area. You notice the skin turns purple or black and  falls off. This symptom may be an emergency. Get help right away. Call 911. Do not wait to see if the symptom will go away. Do not drive yourself to the hospital. This information is not intended to replace advice given to you by your health care provider. Make sure you discuss any questions you have with your health care provider. Document Revised: 06/16/2022 Document Reviewed: 06/16/2022 Elsevier Patient Education  2024 [employer].

## 2024-09-18 DIAGNOSIS — S92352D Displaced fracture of fifth metatarsal bone, left foot, subsequent encounter for fracture with routine healing: Secondary | ICD-10-CM | POA: Diagnosis not present

## 2024-12-12 ENCOUNTER — Encounter: Admitting: Physician Assistant
